# Patient Record
Sex: Male | Born: 1950 | Race: White | Hispanic: No | Marital: Single | Smoking: Former smoker
Health system: Southern US, Community
[De-identification: ages and names within clinical notes are randomized; demographics above are authoritative.]

## PROBLEM LIST (undated history)

## (undated) DIAGNOSIS — G5601 Carpal tunnel syndrome, right upper limb: Secondary | ICD-10-CM

## (undated) DIAGNOSIS — M1990 Unspecified osteoarthritis, unspecified site: Secondary | ICD-10-CM

## (undated) DIAGNOSIS — I77811 Abdominal aortic ectasia: Secondary | ICD-10-CM

## (undated) DIAGNOSIS — R351 Nocturia: Secondary | ICD-10-CM

## (undated) DIAGNOSIS — R7303 Prediabetes: Secondary | ICD-10-CM

## (undated) DIAGNOSIS — K219 Gastro-esophageal reflux disease without esophagitis: Secondary | ICD-10-CM

## (undated) DIAGNOSIS — E781 Pure hyperglyceridemia: Secondary | ICD-10-CM

## (undated) DIAGNOSIS — Z973 Presence of spectacles and contact lenses: Secondary | ICD-10-CM

## (undated) HISTORY — DX: Pure hyperglyceridemia: E78.1

---

## 1990-07-23 HISTORY — PX: NASAL SEPTUM SURGERY: SHX37

## 2012-07-14 ENCOUNTER — Ambulatory Visit: Payer: Self-pay | Admitting: Emergency Medicine

## 2012-07-14 VITALS — HR 77 | Temp 98.7°F | Resp 18 | Ht 73.5 in | Wt 240.0 lb

## 2012-07-14 DIAGNOSIS — Z0000 Encounter for general adult medical examination without abnormal findings: Secondary | ICD-10-CM

## 2012-07-14 DIAGNOSIS — Z0289 Encounter for other administrative examinations: Secondary | ICD-10-CM

## 2012-07-14 NOTE — Progress Notes (Signed)
  Subjective:    Patient ID: Kevin Carter, male    DOB: [birth year], 61 y.o.   MRN: [id number]  HPI patient is here for a self-pay DOT. He has no ongoing medical problems. He is on no medications. He has regular checkups by his regular physician.    Review of Systems     Objective:   Physical Exam HEENT exam is unremarkable. His neck is supple. Chest clear to auscultation and percussion. Heart regular rate without murmurs. Abdomen is soft nontender. Genital exam reveals no hernias. Musculoskeletal exam is normal. Repeat blood pressure done was 120/86       Assessment & Plan:  Patient needs to enters for her 2 year DOT card

## 2014-06-30 ENCOUNTER — Encounter: Payer: Self-pay | Admitting: Family Medicine

## 2014-06-30 ENCOUNTER — Ambulatory Visit (INDEPENDENT_AMBULATORY_CARE_PROVIDER_SITE_OTHER): Payer: Self-pay | Admitting: Family Medicine

## 2014-06-30 VITALS — BP 128/82 | HR 72 | Temp 98.0°F | Resp 17 | Ht 74.0 in | Wt 245.0 lb

## 2014-06-30 DIAGNOSIS — Z024 Encounter for examination for driving license: Secondary | ICD-10-CM

## 2014-06-30 DIAGNOSIS — Z029 Encounter for administrative examinations, unspecified: Secondary | ICD-10-CM

## 2014-06-30 NOTE — Progress Notes (Signed)
DOT physical exam  History: 63 year old man who is here for his DOT physical exam. He is not actually using it currently, but continues to maintain it as a backup plan in life  Past medical history: Operations: None Major illnesses: None Regular medications: None Drug allergies: None   Family history: No major problem prominent familial diseases   Social history: Single, stays with his elderly mother. 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 He did have a normal physical examination by his primary care physician not long ago,he needs to lose weight. TMs normal. Eyes PERRLA. Fundi benign. Throat clear. Neck supple without nodes thyromegaly. No carotid bruits. Chest is clear to auscultation. Heart regular without murmurs. Abdomen soft without mass or tenderness. Normal male external genitalia with testes descended. No hernias. Extremities unremarkable. Deep tendon reflexes trace, symmetrical. Straight leg raising test normal. Spine has good range of motion. Extremities unremarkable.  Assessment: Normal physical examination  Plan: 2 year card. Does wear correction lenses.

## 2014-06-30 NOTE — Patient Instructions (Signed)
Come back as needed

## 2015-03-21 ENCOUNTER — Encounter: Payer: Self-pay | Admitting: Cardiovascular Disease

## 2015-07-24 HISTORY — PX: COLONOSCOPY: SHX5424

## 2016-06-20 ENCOUNTER — Ambulatory Visit (INDEPENDENT_AMBULATORY_CARE_PROVIDER_SITE_OTHER): Payer: Self-pay | Admitting: Physician Assistant

## 2016-06-20 ENCOUNTER — Ambulatory Visit: Payer: Self-pay | Admitting: Family Medicine

## 2016-06-20 VITALS — BP 136/84 | HR 79 | Temp 98.7°F | Resp 17 | Ht 71.5 in | Wt 240.0 lb

## 2016-06-20 DIAGNOSIS — Z0289 Encounter for other administrative examinations: Secondary | ICD-10-CM

## 2016-06-20 NOTE — Patient Instructions (Signed)
     IF you received an x-ray today, you will receive an invoice from [facility]. Please contact [facility] at [phone] with questions or concerns regarding your invoice.   IF you received labwork today, you will receive an invoice from [facility]. Please contact [facility] at [phone] with questions or concerns regarding your invoice.   Our billing staff will not be able to assist you with questions regarding bills from these companies.  You will be contacted with the lab results as soon as they are available. The fastest way to get your results is to activate your My Chart account. Instructions are located on the last page of this paperwork. If you have not heard from us regarding the results in 2 weeks, please contact this office.      

## 2016-06-26 NOTE — Progress Notes (Signed)
[facility] [address], [place] [address] [phone]  Date:  06/20/2016   Name:  Kevin Carter   DOB:  05/24/1951   MRN:  [id number]  PCP:  No PCP Per Patient    History of Present Illness:  Kevin Carter is a 65 y.o. male patient who [facility] dot No concerns at this time.   There are no active problems to display for this patient.   No past medical history on file.  No past surgical history on file.  Social History  Substance Use Topics  . Smoking status: Never Smoker  . Smokeless tobacco: Never Used  . Alcohol use No    No family history on file.  No Known Allergies  Medication list has been reviewed and updated.  No current outpatient prescriptions on file prior to visit.   No current facility-administered medications on file prior to visit.     Review of Systems  Constitutional: Negative for chills and fever.  HENT: Negative for ear discharge, ear pain and sore throat.   Eyes: Negative for blurred vision and double vision.  Respiratory: Negative for cough, shortness of breath and wheezing.   Cardiovascular: Negative for chest pain, palpitations and leg swelling.  Gastrointestinal: Negative for diarrhea, nausea and vomiting.  Genitourinary: Negative for dysuria, frequency and hematuria.  Skin: Negative for itching and rash.  Neurological: Negative for dizziness and headaches.     Physical Examination: BP 136/84 (BP Location: Right Arm, Patient Position: Sitting, Cuff Size: Large)   Pulse 79   Temp 98.7 F (37.1 C) (Oral)   Resp 17   Ht 5' 11.5" (1.816 m)   Wt 240 lb (108.9 kg)   SpO2 97%   BMI 33.01 kg/m  Ideal Body Weight: Weight in (lb) to have BMI = 25: 181.4  Physical Exam  Constitutional: He is oriented to person, place, and time. He appears well-developed and well-nourished. No distress.  HENT:  Head: Normocephalic and atraumatic.  Right Ear: Tympanic membrane, external ear and ear canal  normal.  Left Ear: Tympanic membrane, external ear and ear canal normal.  Eyes: Conjunctivae and EOM are normal. Pupils are equal, round, and reactive to light.  Cardiovascular: Normal rate and regular rhythm.  Exam reveals no friction rub.   No murmur heard. Pulmonary/Chest: Effort normal. No respiratory distress. He has no wheezes.  Abdominal: Soft. Bowel sounds are normal. He exhibits no distension and no mass. There is no tenderness.  Musculoskeletal: Normal range of motion. He exhibits no edema or tenderness.  Neurological: He is alert and oriented to person, place, and time. He displays normal reflexes.  Skin: Skin is warm and dry. He is not diaphoretic.  Psychiatric: He has a normal mood and affect. His behavior is normal.    Visual Acuity Screening   Right eye Left eye Both eyes  Without correction:     With correction: 20/20 20/20 20/20   Hearing Screening Comments: Peripheral Vision: Right eye 85 degrees. Left eye 85 degrees. The patient can distinguish the colors red, amber and green. The patient was able to hear a forced whisper from L=10 R=10  feet.  Assessment and Plan: Kevin Carter is a 65 y.o. male who is here today for dot. 2 yr given. No diagnosis found.  Trena PlattStephanie Cordaryl Decelles, PA-C [facility] 12/5/20178:48 AM  .se  There are no diagnoses linked to this encounter.  Trena PlattStephanie Terrie Haring, PA-C [facility] 06/26/2016 8:46 AM

## 2017-04-23 ENCOUNTER — Ambulatory Visit: Payer: Self-pay | Admitting: Orthopedic Surgery

## 2017-04-23 ENCOUNTER — Telehealth: Payer: Self-pay | Admitting: Cardiovascular Disease

## 2017-04-23 NOTE — Telephone Encounter (Signed)
Pt brought in surgical clearance for Right Total Knee Replacement for Dr. Allyson Sabal. Pt has not seen Dr. Allyson Sabal and will need new patient appointment for this. Message routed to scheduling to arrange this.

## 2017-04-23 NOTE — Progress Notes (Signed)
Please place orders in EPIC as patient is being scheduled for a pre-op appointment! Thank you! 

## 2017-04-24 ENCOUNTER — Ambulatory Visit: Payer: Self-pay | Admitting: Cardiovascular Disease

## 2017-05-02 NOTE — Patient Instructions (Addendum)
Sajid Ruppert  05/02/2017   Your procedure is scheduled on: 05-13-17  Report to [facility] Main  Entrance Follow Sign to 1st Floor Short Stay at 5:50 AM   Call this number if you have problems the morning of surgery [phone]   Remember: ONLY 1 PERSON MAY GO WITH YOU TO SHORT STAY TO GET  READY MORNING OF YOUR SURGERY.  Do not eat food or drink liquids :After Midnight.     Take these medicines the morning of surgery with A SIP OF WATER: None                                You may not have any metal on your body including hair pins and              piercings  Do not wear jewelry, lotions, powders, deodorant             Men may shave face and neck.   Do not bring valuables to the hospital. [facility] IS NOT             RESPONSIBLE   FOR VALUABLES.  Contacts, dentures or bridgework may not be worn into surgery.  Leave suitcase in the car. After surgery it may be brought to your room.                 Please read over the following fact sheets you were given: _____________________________________________________________________             [facility] - Preparing for Surgery Before surgery, you can play an important role.  Because skin is not sterile, your skin needs to be as free of germs as possible.  You can reduce the number of germs on your skin by washing with CHG (chlorahexidine gluconate) soap before surgery.  CHG is an antiseptic cleaner which kills germs and bonds with the skin to continue killing germs even after washing. Please DO NOT use if you have an allergy to CHG or antibacterial soaps.  If your skin becomes reddened/irritated stop using the CHG and inform your nurse when you arrive at Short Stay. Do not shave (including legs and underarms) for at least 48 hours prior to the first CHG shower.  You may shave your face/neck. Please follow these instructions carefully:  1.  Shower with CHG Soap the night before surgery and the  morning of  Surgery.  2.  If you choose to wash your hair, wash your hair first as usual with your  normal  shampoo.  3.  After you shampoo, rinse your hair and body thoroughly to remove the  shampoo.                           4.  Use CHG as you would any other liquid soap.  You can apply chg directly  to the skin and wash                       Gently with a scrungie or clean washcloth.  5.  Apply the CHG Soap to your body ONLY FROM THE NECK DOWN.   Do not use on face/ open  Wound or open sores. Avoid contact with eyes, ears mouth and genitals (private parts).                       Wash face,  Genitals (private parts) with your normal soap.             6.  Wash thoroughly, paying special attention to the area where your surgery  will be performed.  7.  Thoroughly rinse your body with warm water from the neck down.  8.  DO NOT shower/wash with your normal soap after using and rinsing off  the CHG Soap.                9.  Pat yourself dry with a clean towel.            10.  Wear clean pajamas.            11.  Place clean sheets on your bed the night of your first shower and do not  sleep with pets. Day of Surgery : Do not apply any lotions/deodorants the morning of surgery.  Please wear clean clothes to the hospital/surgery center.  FAILURE TO FOLLOW THESE INSTRUCTIONS MAY RESULT IN THE CANCELLATION OF YOUR SURGERY PATIENT SIGNATURE_________________________________  NURSE SIGNATURE__________________________________  ________________________________________________________________________   Adam Phenix  An incentive spirometer is a tool that can help keep your lungs clear and active. This tool measures how well you are filling your lungs with each breath. Taking long deep breaths may help reverse or decrease the chance of developing breathing (pulmonary) problems (especially infection) following:  A long period of time when you are unable to move or be active. BEFORE  THE PROCEDURE   If the spirometer includes an indicator to show your best effort, your nurse or respiratory therapist will set it to a desired goal.  If possible, sit up straight or lean slightly forward. Try not to slouch.  Hold the incentive spirometer in an upright position. INSTRUCTIONS FOR USE  1. Sit on the edge of your bed if possible, or sit up as far as you can in bed or on a chair. 2. Hold the incentive spirometer in an upright position. 3. Breathe out normally. 4. Place the mouthpiece in your mouth and seal your lips tightly around it. 5. Breathe in slowly and as deeply as possible, raising the piston or the ball toward the top of the column. 6. Hold your breath for 3-5 seconds or for as long as possible. Allow the piston or ball to fall to the bottom of the column. 7. Remove the mouthpiece from your mouth and breathe out normally. 8. Rest for a few seconds and repeat Steps 1 through 7 at least 10 times every 1-2 hours when you are awake. Take your time and take a few normal breaths between deep breaths. 9. The spirometer may include an indicator to show your best effort. Use the indicator as a goal to work toward during each repetition. 10. After each set of 10 deep breaths, practice coughing to be sure your lungs are clear. If you have an incision (the cut made at the time of surgery), support your incision when coughing by placing a pillow or rolled up towels firmly against it. Once you are able to get out of bed, walk around indoors and cough well. You may stop using the incentive spirometer when instructed by your caregiver.  RISKS AND COMPLICATIONS  Take your time so you do not get  dizzy or light-headed.  If you are in pain, you may need to take or ask for pain medication before doing incentive spirometry. It is harder to take a deep breath if you are having pain. AFTER USE  Rest and breathe slowly and easily.  It can be helpful to keep track of a log of your progress.  Your caregiver can provide you with a simple table to help with this. If you are using the spirometer at home, follow these instructions: [place] IF:   You are having difficultly using the spirometer.  You have trouble using the spirometer as often as instructed.  Your pain medication is not giving enough relief while using the spirometer.  You develop fever of 100.5 F (38.1 C) or higher. SEEK IMMEDIATE MEDICAL CARE IF:   You cough up bloody sputum that had not been present before.  You develop fever of 102 F (38.9 C) or greater.  You develop worsening pain at or near the incision site. MAKE SURE YOU:   Understand these instructions.  Will watch your condition.  Will get help right away if you are not doing well or get worse. Document Released: 11/19/2006 Document Revised: 10/01/2011 Document Reviewed: 01/20/2007 ExitCare Patient Information 2014 ExitCare, [place].   ________________________________________________________________________  WHAT IS A BLOOD TRANSFUSION? Blood Transfusion Information  A transfusion is the replacement of blood or some of its parts. Blood is made up of multiple cells which provide different functions.  Red blood cells carry oxygen and are used for blood loss replacement.  White blood cells fight against infection.  Platelets control bleeding.  Plasma helps clot blood.  Other blood products are available for specialized needs, such as hemophilia or other clotting disorders. BEFORE THE TRANSFUSION  Who gives blood for transfusions?   Healthy volunteers who are fully evaluated to make sure their blood is safe. This is blood bank blood. Transfusion therapy is the safest it has ever been in the practice of medicine. Before blood is taken from a donor, a complete history is taken to make sure that person has no history of diseases nor engages in risky social behavior (examples are intravenous drug use or sexual activity with multiple  partners). The donor's travel history is screened to minimize risk of transmitting infections, such as malaria. The donated blood is tested for signs of infectious diseases, such as HIV and hepatitis. The blood is then tested to be sure it is compatible with you in order to minimize the chance of a transfusion reaction. If you or a relative donates blood, this is often done in anticipation of surgery and is not appropriate for emergency situations. It takes many days to process the donated blood. RISKS AND COMPLICATIONS Although transfusion therapy is very safe and saves many lives, the main dangers of transfusion include:   Getting an infectious disease.  Developing a transfusion reaction. This is an allergic reaction to something in the blood you were given. Every precaution is taken to prevent this. The decision to have a blood transfusion has been considered carefully by your caregiver before blood is given. Blood is not given unless the benefits outweigh the risks. AFTER THE TRANSFUSION  Right after receiving a blood transfusion, you will usually feel much better and more energetic. This is especially true if your red blood cells have gotten low (anemic). The transfusion raises the level of the red blood cells which carry oxygen, and this usually causes an energy increase.  The nurse administering the transfusion will  monitor you carefully for complications. HOME CARE INSTRUCTIONS  No special instructions are needed after a transfusion. You may find your energy is better. Speak with your caregiver about any limitations on activity for underlying diseases you may have. SEEK MEDICAL CARE IF:   Your condition is not improving after your transfusion.  You develop redness or irritation at the intravenous (IV) site. SEEK IMMEDIATE MEDICAL CARE IF:  Any of the following symptoms occur over the next 12 hours:  Shaking chills.  You have a temperature by mouth above 102 F (38.9 C), not  controlled by medicine.  Chest, back, or muscle pain.  People around you feel you are not acting correctly or are confused.  Shortness of breath or difficulty breathing.  Dizziness and fainting.  You get a rash or develop hives.  You have a decrease in urine output.  Your urine turns a dark color or changes to pink, red, or brown. Any of the following symptoms occur over the next 10 days:  You have a temperature by mouth above 102 F (38.9 C), not controlled by medicine.  Shortness of breath.  Weakness after normal activity.  The white part of the eye turns yellow (jaundice).  You have a decrease in the amount of urine or are urinating less often.  Your urine turns a dark color or changes to pink, red, or brown. Document Released: 07/06/2000 Document Revised: 10/01/2011 Document Reviewed: 02/23/2008 ExitCare Patient Information 2014 ExitCare, [place].  _______________________________________________________________________ WHAT IS A BLOOD TRANSFUSION? Blood Transfusion Information  A transfusion is the replacement of blood or some of its parts. Blood is made up of multiple cells which provide different functions.  Red blood cells carry oxygen and are used for blood loss replacement.  White blood cells fight against infection.  Platelets control bleeding.  Plasma helps clot blood.  Other blood products are available for specialized needs, such as hemophilia or other clotting disorders. BEFORE THE TRANSFUSION  Who gives blood for transfusions?   Healthy volunteers who are fully evaluated to make sure their blood is safe. This is blood bank blood. Transfusion therapy is the safest it has ever been in the practice of medicine. Before blood is taken from a donor, a complete history is taken to make sure that person has no history of diseases nor engages in risky social behavior (examples are intravenous drug use or sexual activity with multiple partners). The donor's travel  history is screened to minimize risk of transmitting infections, such as malaria. The donated blood is tested for signs of infectious diseases, such as HIV and hepatitis. The blood is then tested to be sure it is compatible with you in order to minimize the chance of a transfusion reaction. If you or a relative donates blood, this is often done in anticipation of surgery and is not appropriate for emergency situations. It takes many days to process the donated blood. RISKS AND COMPLICATIONS Although transfusion therapy is very safe and saves many lives, the main dangers of transfusion include:   Getting an infectious disease.  Developing a transfusion reaction. This is an allergic reaction to something in the blood you were given. Every precaution is taken to prevent this. The decision to have a blood transfusion has been considered carefully by your caregiver before blood is given. Blood is not given unless the benefits outweigh the risks. AFTER THE TRANSFUSION  Right after receiving a blood transfusion, you will usually feel much better and more energetic. This is especially true if your  red blood cells have gotten low (anemic). The transfusion raises the level of the red blood cells which carry oxygen, and this usually causes an energy increase.  The nurse administering the transfusion will monitor you carefully for complications. HOME CARE INSTRUCTIONS  No special instructions are needed after a transfusion. You may find your energy is better. Speak with your caregiver about any limitations on activity for underlying diseases you may have. SEEK MEDICAL CARE IF:   Your condition is not improving after your transfusion.  You develop redness or irritation at the intravenous (IV) site. SEEK IMMEDIATE MEDICAL CARE IF:  Any of the following symptoms occur over the next 12 hours:  Shaking chills.  You have a temperature by mouth above 102 F (38.9 C), not controlled by medicine.  Chest,  back, or muscle pain.  People around you feel you are not acting correctly or are confused.  Shortness of breath or difficulty breathing.  Dizziness and fainting.  You get a rash or develop hives.  You have a decrease in urine output.  Your urine turns a dark color or changes to pink, red, or brown. Any of the following symptoms occur over the next 10 days:  You have a temperature by mouth above 102 F (38.9 C), not controlled by medicine.  Shortness of breath.  Weakness after normal activity.  The white part of the eye turns yellow (jaundice).  You have a decrease in the amount of urine or are urinating less often.  Your urine turns a dark color or changes to pink, red, or brown. Document Released: 07/06/2000 Document Revised: 10/01/2011 Document Reviewed: 02/23/2008 [facility] Patient Information 2014 [place], [place].  _______________________________________________________________________

## 2017-05-03 ENCOUNTER — Encounter: Payer: Self-pay | Admitting: Physician Assistant

## 2017-05-03 ENCOUNTER — Ambulatory Visit (INDEPENDENT_AMBULATORY_CARE_PROVIDER_SITE_OTHER): Payer: Managed Care, Other (non HMO) | Admitting: Physician Assistant

## 2017-05-03 VITALS — BP 124/68 | HR 64 | Ht 71.5 in | Wt 213.0 lb

## 2017-05-03 DIAGNOSIS — Z01810 Encounter for preprocedural cardiovascular examination: Secondary | ICD-10-CM | POA: Diagnosis not present

## 2017-05-03 DIAGNOSIS — E781 Pure hyperglyceridemia: Secondary | ICD-10-CM | POA: Diagnosis not present

## 2017-05-03 DIAGNOSIS — R7303 Prediabetes: Secondary | ICD-10-CM | POA: Diagnosis not present

## 2017-05-03 DIAGNOSIS — F1011 Alcohol abuse, in remission: Secondary | ICD-10-CM | POA: Diagnosis not present

## 2017-05-03 NOTE — Patient Instructions (Signed)
Medication Instructions:   No changes  Labwork:   none  Testing/Procedures:  none  Follow-Up:  On as needed basis with Dr. San Morelle are cleared from a cardiac standpoint for your upcoming surgery by Dr. Despina Hick. We will send documentation to his office.   If you need a refill on your cardiac medications before your next appointment, please call your pharmacy.

## 2017-05-03 NOTE — Progress Notes (Signed)
Cardiology Office Note    Date:  05/03/2017   ID:  Kevin Carter, DOB 1951/03/13, MRN [id number]  PCP:  [facility]  Cardiologist:  Dr. Allyson Sabal remotely   Chief Complaint  Patient presents with  . New Patient (Initial Visit)    remotely seen by Dr. Allyson Sabal per patient report, case discussed with DOD Dr. Swaziland   Preoperative clearance requested by Dr. Despina Hick for right total knee arthroplasty  History of Present Illness:  Kevin Carter is a 66 y.o. male with PMH of prediabetes (hgb A1C 5.8 on 06/01/2016 at [facility]) and hypertriglyceridemia (triglyceride 232 on 06/01/2016). He was reportedly evaluated by Dr. Allyson Sabal in 2013 for atypical chest pain. He had a stress test on 04/01/2012 that showed EF 57%, exercise ability 9 METS, moderate perfusion defect due to infarct/scar with mild peri-infarct ischemia in the basal inferoseptal, basal inferior, and mid inferior region. Analysis of post exercise quantitative gated SPECT images showed a mild hypokinesis in the basal inferior, basal inferoseptal and mid inferior region. According to the patient, no further workup was done due to the atypical nature of the symptom. I am unable to locate any previous office note. Since then, he continued to work in the [employer]. In the past 6 months, he was able to lose 32 pounds. He used to drink about 6 beers per night, since July 4, he has stopped drinking as well. He is able to walk 4 blocks away from his home and back without any exertional chest discomfort or shortness breath. He is also able to climb up at least 2 flights of stairs without any issue. 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   I have reviewed his previous stress test image with Dr. Royann Shivers who made the original report in 2013, it was felt this is likely  a low risk study. I have also discussed the case with Dr. Swaziland DOD, given lack of chest discomfort and his exercise ability. No further workup is needed prior to right knee surgery by Dr. Merlyn Albert. He can follow-up with cardiology on an as-needed basis. Otherwise he has no lower extremity edema, orthopnea or PND.    Past Medical History:  Diagnosis Date  . Hypertriglyceridemia   . Prediabetes     Past Surgical History:  Procedure Laterality Date  . NASAL SEPTUM SURGERY      Current Medications: Outpatient Medications Prior to Visit  Medication Sig Dispense Refill  . aspirin EC 81 MG tablet Take 81 mg by mouth at bedtime.    . Multiple Vitamins-Minerals (MULTIVITAMIN PO) Take 1 tablet by mouth daily.    . Tetrahydrozoline HCl (VISINE OP) Place 2 drops into both eyes as needed (for dry eyes).     No facility-administered medications prior to visit.      Allergies:   Patient has no known allergies.   Social History   Social History  . Marital status: Single    Spouse name: N/A  . Number of children: N/A  . Years of education: N/A   Social History Main Topics  . Smoking status: Former Games developer  . Smokeless tobacco: Never Used     Comment: smoked for a few years when he was younger, no smoking since age 40  . Alcohol use No     Comment: last drank 01/23/2017, used to drink 6  beers a night  . Drug use: No  . Sexual activity: No   Other Topics Concern  . None   Social History Narrative  . None     Family History:  The patient's family history includes Diabetes in his father; Hypertension in his mother; Stomach cancer in his father.   ROS:   Please see the history of present illness.    ROS All other systems reviewed and are negative.   PHYSICAL EXAM:   VS:  BP 124/68   Pulse 64   Ht 5' 11.5" (1.816 m)   Wt 213 lb (96.6 kg)   BMI 29.29 kg/m    GEN: Well nourished, well developed, in no acute distress  HEENT: normal  Neck: no JVD, carotid bruits, or  masses Cardiac: RRR; no murmurs, rubs, or gallops,no edema  Respiratory:  clear to auscultation bilaterally, normal work of breathing GI: soft, nontender, nondistended, + BS MS: no deformity or atrophy  Skin: warm and dry, no rash Neuro:  Alert and Oriented x 3, Strength and sensation are intact Psych: euthymic mood, full affect  Wt Readings from Last 3 Encounters:  05/03/17 213 lb (96.6 kg)  06/20/16 240 lb (108.9 kg)  06/30/14 245 lb (111.1 kg)      Studies/Labs Reviewed:   EKG:  EKG is ordered today.  The ekg ordered today demonstrates Normal sinus rhythm without significant ST-T wave changes. No ischemia.  Recent Labs: No results found for requested labs within last 8760 hours.   Lipid Panel No results found for: CHOL, TRIG, HDL, CHOLHDL, VLDL, LDLCALC, LDLDIRECT  Additional studies/ records that were reviewed today include:   Myoview 04/01/2012    ASSESSMENT:    1. Preprocedural cardiovascular examination   2. Prediabetes   3. Hypertriglyceridemia without hypercholesterolemia   4. Alcohol abuse, in remission      PLAN:  In order of problems listed above:  1. Preoperative clearance requested by Dr. Despina Hick prior to right total knee arthroplasty: Although there was a low risk but somewhat abnormal Myoview in 2013, I have reviewed the image with Dr. Royann Shivers, cannot completely rule out diaphragmatic attenuation. No further workup was done in 2013 per patient. Since then, he has not had any exertional chest discomfort or shortness of breath. He carries heavy object at work without any issue. Given lack of EKG changes, no further workup is needed prior to his discharge weight. I have discussed the case with DOD Dr. Swaziland who also agrees.  2. Prediabetes: Last hemoglobin A1c 5.8 at [facility] in November 2017. Will defer to primary care provider for annual lab work. Since last year, he has intentionally lost more than 30 pounds, his hemoglobin A1C may have  improved.  3. Hypertriglyceridemia: Total cholesterol normal, triglyceride 232 in 2017, since he lost 30 pounds in the past year, we'll defer to primary care provider for annual lab work  4. History of alcohol abuse: Has not drank alcohol since fourth of July. Prior to that, he used to drink 6 beers per night. I strongly advised him not to resume.    Medication Adjustments/Labs and Tests Ordered: Current medicines are reviewed at length with the patient today.  Concerns regarding medicines are outlined above.  Medication changes, Labs and Tests ordered today are listed in the Patient Instructions below. Patient Instructions  Medication Instructions:   No changes  Labwork:   none  Testing/Procedures:  none  Follow-Up:  On as needed basis with Dr. San Morelle are cleared from  a cardiac standpoint for your upcoming surgery by Dr. Despina Hick. We will send documentation to his office.   If you need a refill on your cardiac medications before your next appointment, please call your pharmacy.      Ramond Dial, [place]  05/03/2017 12:09 PM    [facility] [address] [place], [place], [place]  [address] Phone: [phone]; Fax: [phone]

## 2017-05-06 ENCOUNTER — Encounter (HOSPITAL_COMMUNITY): Payer: Self-pay

## 2017-05-06 ENCOUNTER — Encounter (HOSPITAL_COMMUNITY)
Admission: RE | Admit: 2017-05-06 | Discharge: 2017-05-06 | Disposition: A | Discharge status: Home / Self Care | Payer: Managed Care, Other (non HMO) | Source: Ambulatory Visit | Attending: Orthopedic Surgery | Admitting: Orthopedic Surgery

## 2017-05-06 DIAGNOSIS — Z01812 Encounter for preprocedural laboratory examination: Secondary | ICD-10-CM | POA: Diagnosis not present

## 2017-05-06 DIAGNOSIS — M1711 Unilateral primary osteoarthritis, right knee: Secondary | ICD-10-CM | POA: Insufficient documentation

## 2017-05-06 DIAGNOSIS — Z0183 Encounter for blood typing: Secondary | ICD-10-CM | POA: Diagnosis not present

## 2017-05-06 HISTORY — DX: Prediabetes: R73.03

## 2017-05-06 LAB — SURGICAL PCR SCREEN
MRSA, PCR: NEGATIVE
STAPHYLOCOCCUS AUREUS: NEGATIVE

## 2017-05-06 LAB — COMPREHENSIVE METABOLIC PANEL
ALBUMIN: 4.2 g/dL (ref 3.5–5.0)
ALK PHOS: 58 U/L (ref 38–126)
ALT: 23 U/L (ref 17–63)
ANION GAP: 9 (ref 5–15)
AST: 21 U/L (ref 15–41)
BUN: 19 mg/dL (ref 6–20)
CALCIUM: 9.2 mg/dL (ref 8.9–10.3)
CO2: 25 mmol/L (ref 22–32)
Chloride: 101 mmol/L (ref 101–111)
Creatinine, Ser: 0.7 mg/dL (ref 0.61–1.24)
GFR calc non Af Amer: >60 mL/min (ref >60)
GLUCOSE: 119 mg/dL — AB (ref 65–99)
POTASSIUM: 4.2 mmol/L (ref 3.5–5.1)
SODIUM: 135 mmol/L (ref 135–145)
TOTAL PROTEIN: 7.4 g/dL (ref 6.5–8.1)
Total Bilirubin: 0.6 mg/dL (ref 0.3–1.2)

## 2017-05-06 LAB — CBC
HCT: 41.8 % (ref 39.0–52.0)
HEMOGLOBIN: 14.4 g/dL (ref 13.0–17.0)
MCH: 30.4 pg (ref 26.0–34.0)
MCHC: 34.4 g/dL (ref 30.0–36.0)
MCV: 88.4 fL (ref 78.0–100.0)
PLATELETS: 225 10*3/uL (ref 150–400)
RBC: 4.73 MIL/uL (ref 4.22–5.81)
RDW: 12.9 % (ref 11.5–15.5)
WBC: 6 10*3/uL (ref 4.0–10.5)

## 2017-05-06 LAB — PROTIME-INR
INR: 0.98
Prothrombin Time: 12.9 seconds (ref 11.4–15.2)

## 2017-05-06 LAB — APTT: APTT: 31 s (ref 24–36)

## 2017-05-06 LAB — HEMOGLOBIN A1C
Hgb A1c MFr Bld: 5.5 % (ref 4.8–5.6)
Mean Plasma Glucose: 111.15 mg/dL

## 2017-05-06 LAB — ABO/RH: ABO/RH(D): B NEG

## 2017-05-06 NOTE — Progress Notes (Signed)
05-03-17 (EPIC) Cardiac clearance from Azalee Course, [place] on chart

## 2017-05-12 ENCOUNTER — Ambulatory Visit: Payer: Self-pay | Admitting: Orthopedic Surgery

## 2017-05-12 NOTE — H&P (Signed)
Kevin Carter DOB: 11/02/1950 Single / Language: English / Race: White Male Date of Admission:  05/13/2017 CC:  Right knee pain History of Present Illness  The patient is a 66 year old male who comes in  for a preoperative History and Physical. The patient is scheduled for a right total knee arthroplasty to be performed by Dr. Frank V. Aluisio, MD at [facility] on 05-13-2017. The patient is a 65 year old male who presented for follow up of their knee. The patient is being followed for their right knee pain and osteoarthritis. They are now month(s) out from cortisone injection. Symptoms reported include: pain (medial side), pain at night, aching, grinding, giving way and pain with weightbearing. The patient feels that they are doing poorly and report their pain level to be moderate. The following medication has been used for pain control: none. The patient has not gotten any relief of their symptoms with Cortisone injections. The shots did not help much. Unfortunately his right knee pain is gone progressively worse over time. The injections are not working at all. He is at a stage now with the knee pain is essentially taking over his life. He is interested in proceeding with the knee replacement surgery. They have been treated conservatively in the past for the above stated problem and despite conservative measures, they continue to have progressive pain and severe functional limitations and dysfunction. They have failed non-operative management including home exercise, medications, and injections. It is felt that they would benefit from undergoing total joint replacement. Risks and benefits of the procedure have been discussed with the patient and they elect to proceed with surgery. There are no active contraindications to surgery such as ongoing infection or rapidly progressive neurological disease.  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)  Gastroesophageal Reflux Disease  Measles  Mumps  Hypertriglyceridemia   Allergies  No Known Drug Allergies  Family History  Cancer  father Diabetes Mellitus  father Hypertension  mother  Social History  Alcohol use  current drinker; drinks beer; 8-14 per week Children  0 Current work status  working full time Drug/Alcohol Rehab (Currently)  no Drug/Alcohol Rehab (Previously)  no Exercise  Exercises daily; does running / walking Illicit drug use  no Living situation  live alone Marital status  single Number of flights of stairs before winded  4-5 Pain Contract  no Tobacco / smoke exposure  no Tobacco use  former smoker; smoke(d) less than 1/2 pack(s) per day Post-Surgical Plans  Home With Family.  Medication History  Aspirin (81MG Tablet, Oral) Active. Mucinex (Oral) Specific strength unknown - Active. Multi Vitamin (Oral) Active.  Past Surgical History  Straighten Nasal Septum  Date: 1992. Colonoscopy  Date: 2016. 2005    Review of Systems General Present- Weight Loss. Not Present- Chills, Fatigue, Fever, Memory Loss, Night Sweats and Weight Gain. Skin Not Present- Eczema, Hives, Itching, Lesions and Rash. HEENT Not Present- Dentures, Double Vision, Headache, Hearing Loss, Tinnitus and Visual Loss. Respiratory Not Present- Allergies, Chronic Cough, Coughing up blood, Shortness of breath at rest and Shortness of breath with exertion. Cardiovascular Not Present- Chest Pain, Difficulty Breathing Lying Down, Murmur, Palpitations, Racing/skipping heartbeats and Swelling. Gastrointestinal Not Present- Abdominal Pain, Bloody Stool, Constipation, Diarrhea, Difficulty Swallowing, Heartburn, Jaundice, Loss of appetitie, Nausea and Vomiting. Male Genitourinary Present- Urinating at Night. Not Present- Blood in Urine, Discharge, Flank Pain, Incontinence, Painful Urination, Urgency,    Urinary frequency, Urinary Retention and Weak urinary stream. Musculoskeletal Present- Joint Pain and Leg Cramps. Not Present- Back Pain, Joint Swelling, Morning Stiffness, Muscle Pain, Muscle Weakness and Spasms. Neurological Present- Numbness (carpal tunnel). Not Present- Blackout spells, Difficulty with balance, Dizziness, Paralysis, Tremor and Weakness. Psychiatric Present- Change in Sleep Pattern. Not Present- Insomnia.  Vitals  Weight: 215 lb Height: 74in Weight was reported by patient. Height was reported by patient. Body Surface Area: 2.24 m Body Mass Index: 27.6 kg/m  Pulse: 60 (Regular)  BP: 130/64 (Sitting, Right Arm, Standard)     Physical Exa General Mental Status -Alert, cooperative and good historian. General Appearance-pleasant, Not in acute distress. Orientation-Oriented X3. Build & Nutrition-Well nourished and Well developed.  Head and Neck Head-normocephalic, atraumatic . Neck Global Assessment - supple, no bruit auscultated on the right, no bruit auscultated on the left.  Eye Pupil - Bilateral-Regular and Round. Motion - Bilateral-EOMI.  Chest and Lung Exam Auscultation Breath sounds - clear at anterior chest wall and clear at posterior chest wall. Adventitious sounds - No Adventitious sounds.  Cardiovascular Auscultation Rhythm - Regular rate and rhythm. Heart Sounds - S1 WNL and S2 WNL. Murmurs & Other Heart Sounds - Auscultation of the heart reveals - No Murmurs.  Abdomen Palpation/Percussion Tenderness - Abdomen is non-tender to palpation. Rigidity (guarding) - Abdomen is soft. Auscultation Auscultation of the abdomen reveals - Bowel sounds normal.  Male Genitourinary Note: Not done, not pertinent to present illness   Musculoskeletal Note: Evaluation of both hips shows normal range of motion. Left knee shows no effusion. Range of motion is 0-125. There is no tenderness or instability of the left knee. Right knee  shows varus deformity. Range 5to 120. He has tenderness along the medial aspect of the right knee. There is no lateral tenderness. There is no instability noted. Pulses sensation and motor are intact distally. Has an antalgic gait pattern on the right. Radiographs are reviewed AP and lateral of the knee has bone-on-bone medial and patellofemoral compartments.   Assessment & Plan  Primary osteoarthritis of right knee (M17.11)  Note:Surgical Plans: Right Total Knee Replacement  Disposition: Home with family  PCP: Dr. Fred Wilson Cards: Dr. Berry GI: Dr. Hung  IV TXA  Anesthesia Issues: None  Patient was instructed on what medications to stop prior to surgery.  Signed electronically by Alexzandrew L Perkins, III PA-C 

## 2017-05-13 ENCOUNTER — Inpatient Hospital Stay (HOSPITAL_COMMUNITY): Payer: Managed Care, Other (non HMO) | Admitting: Anesthesiology

## 2017-05-13 ENCOUNTER — Encounter (HOSPITAL_COMMUNITY)
Admission: RE | Disposition: A | Discharge status: Home / Self Care | Payer: Self-pay | Source: Ambulatory Visit | Attending: Orthopedic Surgery

## 2017-05-13 ENCOUNTER — Inpatient Hospital Stay (HOSPITAL_COMMUNITY)
Admission: RE | Admit: 2017-05-13 | Discharge: 2017-05-15 | DRG: 470 | Disposition: A | Discharge status: Home / Self Care | Payer: Managed Care, Other (non HMO) | Source: Ambulatory Visit | Attending: Orthopedic Surgery | Admitting: Orthopedic Surgery

## 2017-05-13 ENCOUNTER — Encounter (HOSPITAL_COMMUNITY): Payer: Self-pay | Admitting: *Deleted

## 2017-05-13 DIAGNOSIS — M25561 Pain in right knee: Secondary | ICD-10-CM | POA: Diagnosis present

## 2017-05-13 DIAGNOSIS — Z87891 Personal history of nicotine dependence: Secondary | ICD-10-CM

## 2017-05-13 DIAGNOSIS — E781 Pure hyperglyceridemia: Secondary | ICD-10-CM | POA: Diagnosis present

## 2017-05-13 DIAGNOSIS — M1711 Unilateral primary osteoarthritis, right knee: Principal | ICD-10-CM | POA: Diagnosis present

## 2017-05-13 DIAGNOSIS — R7303 Prediabetes: Secondary | ICD-10-CM | POA: Diagnosis present

## 2017-05-13 DIAGNOSIS — M179 Osteoarthritis of knee, unspecified: Secondary | ICD-10-CM

## 2017-05-13 DIAGNOSIS — Z79899 Other long term (current) drug therapy: Secondary | ICD-10-CM

## 2017-05-13 DIAGNOSIS — Z7982 Long term (current) use of aspirin: Secondary | ICD-10-CM | POA: Diagnosis not present

## 2017-05-13 DIAGNOSIS — K219 Gastro-esophageal reflux disease without esophagitis: Secondary | ICD-10-CM | POA: Diagnosis present

## 2017-05-13 DIAGNOSIS — M1710 Unilateral primary osteoarthritis, unspecified knee: Secondary | ICD-10-CM | POA: Diagnosis present

## 2017-05-13 DIAGNOSIS — M25761 Osteophyte, right knee: Secondary | ICD-10-CM | POA: Diagnosis present

## 2017-05-13 HISTORY — PX: TOTAL KNEE ARTHROPLASTY: SHX125

## 2017-05-13 SURGERY — ARTHROPLASTY, KNEE, TOTAL
Anesthesia: Regional | Site: Knee | Laterality: Right

## 2017-05-13 MED ORDER — ACETAMINOPHEN 500 MG PO TABS
1000.0000 mg | ORAL_TABLET | Freq: Four times a day (QID) | ORAL | Status: AC
  Administered 2017-05-13 – 2017-05-14 (×4): 1000 mg via ORAL
  Filled 2017-05-13 (×4): qty 2

## 2017-05-13 MED ORDER — PHENOL 1.4 % MT LIQD: 1.0000 | OROMUCOSAL | Status: DC | PRN

## 2017-05-13 MED ORDER — OXYCODONE HCL 5 MG PO TABS
10.0000 mg | ORAL_TABLET | ORAL | Status: DC | PRN
  Administered 2017-05-13 – 2017-05-15 (×9): 10 mg via ORAL
  Filled 2017-05-13 (×9): qty 2

## 2017-05-13 MED ORDER — FENTANYL CITRATE (PF) 100 MCG/2ML IJ SOLN
INTRAMUSCULAR | Status: AC
  Administered 2017-05-13: 100 ug via INTRAVENOUS
  Filled 2017-05-13: qty 2

## 2017-05-13 MED ORDER — LIDOCAINE 2% (20 MG/ML) 5 ML SYRINGE
INTRAMUSCULAR | Status: DC | PRN
  Administered 2017-05-13: 60 mg via INTRAVENOUS

## 2017-05-13 MED ORDER — OXYCODONE HCL 5 MG PO TABS
5.0000 mg | ORAL_TABLET | ORAL | Status: DC | PRN
  Administered 2017-05-13: 12:00:00 5 mg via ORAL
  Filled 2017-05-13 (×2): qty 1

## 2017-05-13 MED ORDER — FENTANYL CITRATE (PF) 100 MCG/2ML IJ SOLN
INTRAMUSCULAR | Status: AC
Start: 2017-05-13 — End: 2017-05-13
  Filled 2017-05-13: qty 2

## 2017-05-13 MED ORDER — BUPIVACAINE HCL (PF) 0.75 % IJ SOLN
INTRAMUSCULAR | Status: DC | PRN
  Administered 2017-05-13: 2 mL via INTRATHECAL

## 2017-05-13 MED ORDER — MIDAZOLAM HCL 2 MG/2ML IJ SOLN
INTRAMUSCULAR | Status: AC
  Administered 2017-05-13: 1 mg via INTRAVENOUS
  Filled 2017-05-13: qty 2

## 2017-05-13 MED ORDER — BUPIVACAINE LIPOSOME 1.3 % IJ SUSP
20.0000 mL | Freq: Once | INTRAMUSCULAR | Status: DC
  Filled 2017-05-13: qty 20

## 2017-05-13 MED ORDER — PROPOFOL 500 MG/50ML IV EMUL
INTRAVENOUS | Status: DC | PRN
  Administered 2017-05-13: 200 mg via INTRAVENOUS

## 2017-05-13 MED ORDER — MORPHINE SULFATE (PF) 4 MG/ML IV SOLN
1.0000 mg | INTRAVENOUS | Status: DC | PRN
  Administered 2017-05-13: 12:00:00 1 mg via INTRAVENOUS
  Filled 2017-05-13: qty 1

## 2017-05-13 MED ORDER — HYDROMORPHONE HCL 1 MG/ML IJ SOLN: 0.2500 mg | INTRAMUSCULAR | Status: DC | PRN

## 2017-05-13 MED ORDER — MIDAZOLAM HCL 2 MG/2ML IJ SOLN
1.0000 mg | INTRAMUSCULAR | Status: DC
  Administered 2017-05-13: 1 mg via INTRAVENOUS

## 2017-05-13 MED ORDER — OXYCODONE HCL 5 MG/5ML PO SOLN: 5.0000 mg | Freq: Once | ORAL | Status: DC | PRN

## 2017-05-13 MED ORDER — CEFAZOLIN SODIUM-DEXTROSE 2-4 GM/100ML-% IV SOLN
INTRAVENOUS | Status: AC
  Filled 2017-05-13: qty 100

## 2017-05-13 MED ORDER — SODIUM CHLORIDE 0.9 % IJ SOLN
INTRAMUSCULAR | Status: DC | PRN
  Administered 2017-05-13: 60 mL

## 2017-05-13 MED ORDER — ACETAMINOPHEN 325 MG PO TABS: 650.0000 mg | ORAL_TABLET | ORAL | Status: DC | PRN

## 2017-05-13 MED ORDER — DOCUSATE SODIUM 100 MG PO CAPS
100.0000 mg | ORAL_CAPSULE | Freq: Two times a day (BID) | ORAL | Status: DC
  Administered 2017-05-13 – 2017-05-15 (×4): 100 mg via ORAL
  Filled 2017-05-13 (×4): qty 1

## 2017-05-13 MED ORDER — GABAPENTIN 300 MG PO CAPS
300.0000 mg | ORAL_CAPSULE | Freq: Once | ORAL | Status: AC
  Administered 2017-05-13: 300 mg via ORAL

## 2017-05-13 MED ORDER — OXYCODONE HCL 5 MG PO TABS: 5.0000 mg | ORAL_TABLET | Freq: Once | ORAL | Status: DC | PRN

## 2017-05-13 MED ORDER — CHLORHEXIDINE GLUCONATE 4 % EX LIQD: 60.0000 mL | Freq: Once | CUTANEOUS | Status: DC

## 2017-05-13 MED ORDER — CEFAZOLIN SODIUM-DEXTROSE 2-4 GM/100ML-% IV SOLN
2.0000 g | INTRAVENOUS | Status: AC
  Administered 2017-05-13: 2 g via INTRAVENOUS

## 2017-05-13 MED ORDER — STERILE WATER FOR IRRIGATION IR SOLN
Status: DC | PRN
  Administered 2017-05-13: 3000 mL

## 2017-05-13 MED ORDER — PHENYLEPHRINE HCL 10 MG/ML IJ SOLN
INTRAMUSCULAR | Status: DC | PRN
  Administered 2017-05-13: 100 ug via INTRAVENOUS

## 2017-05-13 MED ORDER — GABAPENTIN 300 MG PO CAPS
ORAL_CAPSULE | ORAL | Status: AC
  Administered 2017-05-13: 300 mg via ORAL
  Filled 2017-05-13: qty 1

## 2017-05-13 MED ORDER — METHOCARBAMOL 500 MG PO TABS
500.0000 mg | ORAL_TABLET | Freq: Four times a day (QID) | ORAL | Status: DC | PRN
  Administered 2017-05-14: 10:00:00 500 mg via ORAL
  Filled 2017-05-13: qty 1

## 2017-05-13 MED ORDER — POLYETHYLENE GLYCOL 3350 17 G PO PACK: 17.0000 g | PACK | Freq: Every day | ORAL | Status: DC | PRN

## 2017-05-13 MED ORDER — METOCLOPRAMIDE HCL 5 MG/ML IJ SOLN: 5.0000 mg | Freq: Three times a day (TID) | INTRAMUSCULAR | Status: DC | PRN

## 2017-05-13 MED ORDER — SODIUM CHLORIDE 0.9 % IJ SOLN
INTRAMUSCULAR | Status: AC
  Filled 2017-05-13: qty 50

## 2017-05-13 MED ORDER — ROPIVACAINE HCL 7.5 MG/ML IJ SOLN
INTRAMUSCULAR | Status: DC | PRN
  Administered 2017-05-13: 20 mL via PERINEURAL

## 2017-05-13 MED ORDER — TRANEXAMIC ACID 1000 MG/10ML IV SOLN
1000.0000 mg | INTRAVENOUS | Status: AC
  Administered 2017-05-13: 1000 mg via INTRAVENOUS
  Filled 2017-05-13: qty 1100

## 2017-05-13 MED ORDER — PROPOFOL 10 MG/ML IV BOLUS
INTRAVENOUS | Status: AC
  Filled 2017-05-13: qty 60

## 2017-05-13 MED ORDER — MIDAZOLAM HCL 5 MG/5ML IJ SOLN
INTRAMUSCULAR | Status: DC | PRN
Start: 2017-05-13 — End: 2017-05-13
  Administered 2017-05-13: 2 mg via INTRAVENOUS

## 2017-05-13 MED ORDER — SODIUM CHLORIDE 0.9 % IJ SOLN
INTRAMUSCULAR | Status: AC
  Filled 2017-05-13: qty 10

## 2017-05-13 MED ORDER — FLEET ENEMA 7-19 GM/118ML RE ENEM: 1.0000 | ENEMA | Freq: Once | RECTAL | Status: DC | PRN

## 2017-05-13 MED ORDER — SODIUM CHLORIDE 0.9 % IV SOLN
INTRAVENOUS | Status: DC
  Administered 2017-05-13: 12:00:00 via INTRAVENOUS

## 2017-05-13 MED ORDER — TRAMADOL HCL 50 MG PO TABS: 50.0000 mg | ORAL_TABLET | Freq: Four times a day (QID) | ORAL | Status: DC | PRN

## 2017-05-13 MED ORDER — FENTANYL CITRATE (PF) 100 MCG/2ML IJ SOLN
INTRAMUSCULAR | Status: DC | PRN
  Administered 2017-05-13: 100 ug via INTRAVENOUS

## 2017-05-13 MED ORDER — EPHEDRINE SULFATE 50 MG/ML IJ SOLN
INTRAMUSCULAR | Status: DC | PRN
  Administered 2017-05-13: 15 mg via INTRAVENOUS
  Administered 2017-05-13 (×3): 10 mg via INTRAVENOUS

## 2017-05-13 MED ORDER — MIDAZOLAM HCL 2 MG/2ML IJ SOLN
INTRAMUSCULAR | Status: AC
  Filled 2017-05-13: qty 2

## 2017-05-13 MED ORDER — ACETAMINOPHEN 650 MG RE SUPP: 650.0000 mg | RECTAL | Status: DC | PRN

## 2017-05-13 MED ORDER — BUPIVACAINE LIPOSOME 1.3 % IJ SUSP
INTRAMUSCULAR | Status: DC | PRN
  Administered 2017-05-13: 20 mL

## 2017-05-13 MED ORDER — DEXAMETHASONE SODIUM PHOSPHATE 10 MG/ML IJ SOLN
10.0000 mg | Freq: Once | INTRAMUSCULAR | Status: AC
  Administered 2017-05-14: 10:00:00 10 mg via INTRAVENOUS
  Filled 2017-05-13: qty 1

## 2017-05-13 MED ORDER — METOCLOPRAMIDE HCL 5 MG PO TABS: 5.0000 mg | ORAL_TABLET | Freq: Three times a day (TID) | ORAL | Status: DC | PRN

## 2017-05-13 MED ORDER — DIPHENHYDRAMINE HCL 12.5 MG/5ML PO ELIX: 12.5000 mg | ORAL_SOLUTION | ORAL | Status: DC | PRN

## 2017-05-13 MED ORDER — ONDANSETRON HCL 4 MG PO TABS: 4.0000 mg | ORAL_TABLET | Freq: Four times a day (QID) | ORAL | Status: DC | PRN

## 2017-05-13 MED ORDER — LACTATED RINGERS IV SOLN
INTRAVENOUS | Status: DC
  Administered 2017-05-13 (×2): via INTRAVENOUS

## 2017-05-13 MED ORDER — RIVAROXABAN 10 MG PO TABS
10.0000 mg | ORAL_TABLET | Freq: Every day | ORAL | Status: DC
  Administered 2017-05-14 – 2017-05-15 (×2): 10 mg via ORAL
  Filled 2017-05-13 (×2): qty 1

## 2017-05-13 MED ORDER — TRANEXAMIC ACID 1000 MG/10ML IV SOLN
1000.0000 mg | Freq: Once | INTRAVENOUS | Status: AC
  Administered 2017-05-13: 12:00:00 1000 mg via INTRAVENOUS
  Filled 2017-05-13: qty 1100

## 2017-05-13 MED ORDER — BISACODYL 10 MG RE SUPP: 10.0000 mg | Freq: Every day | RECTAL | Status: DC | PRN

## 2017-05-13 MED ORDER — SODIUM CHLORIDE 0.9 % IR SOLN
Status: DC | PRN
  Administered 2017-05-13: 1000 mL

## 2017-05-13 MED ORDER — METHOCARBAMOL 1000 MG/10ML IJ SOLN
500.0000 mg | Freq: Four times a day (QID) | INTRAVENOUS | Status: DC | PRN
  Administered 2017-05-13: 500 mg via INTRAVENOUS
  Filled 2017-05-13: qty 550

## 2017-05-13 MED ORDER — ACETAMINOPHEN 10 MG/ML IV SOLN
1000.0000 mg | Freq: Once | INTRAVENOUS | Status: AC
  Administered 2017-05-13: 1000 mg via INTRAVENOUS

## 2017-05-13 MED ORDER — FENTANYL CITRATE (PF) 100 MCG/2ML IJ SOLN
50.0000 ug | INTRAMUSCULAR | Status: DC | PRN
  Administered 2017-05-13: 100 ug via INTRAVENOUS

## 2017-05-13 MED ORDER — DEXAMETHASONE SODIUM PHOSPHATE 10 MG/ML IJ SOLN
10.0000 mg | Freq: Once | INTRAMUSCULAR | Status: AC
  Administered 2017-05-13: 10 mg via INTRAVENOUS

## 2017-05-13 MED ORDER — ONDANSETRON HCL 4 MG/2ML IJ SOLN: 4.0000 mg | Freq: Four times a day (QID) | INTRAMUSCULAR | Status: DC | PRN

## 2017-05-13 MED ORDER — FENTANYL CITRATE (PF) 100 MCG/2ML IJ SOLN: 50.0000 ug | INTRAMUSCULAR | Status: DC

## 2017-05-13 MED ORDER — PROPOFOL 10 MG/ML IV BOLUS
INTRAVENOUS | Status: AC
  Filled 2017-05-13: qty 20

## 2017-05-13 MED ORDER — ONDANSETRON HCL 4 MG/2ML IJ SOLN
INTRAMUSCULAR | Status: DC | PRN
  Administered 2017-05-13: 4 mg via INTRAVENOUS

## 2017-05-13 MED ORDER — MENTHOL 3 MG MT LOZG: 1.0000 | LOZENGE | OROMUCOSAL | Status: DC | PRN

## 2017-05-13 MED ORDER — ACETAMINOPHEN 10 MG/ML IV SOLN
INTRAVENOUS | Status: AC
  Filled 2017-05-13: qty 100

## 2017-05-13 MED ORDER — CEFAZOLIN SODIUM-DEXTROSE 2-4 GM/100ML-% IV SOLN
2.0000 g | Freq: Four times a day (QID) | INTRAVENOUS | Status: AC
  Administered 2017-05-13 (×2): 2 g via INTRAVENOUS
  Filled 2017-05-13 (×2): qty 100

## 2017-05-13 SURGICAL SUPPLY — 50 items
BAG DECANTER FOR FLEXI CONT (MISCELLANEOUS) ×3 IMPLANT
BAG ZIPLOCK 12X15 (MISCELLANEOUS) ×3 IMPLANT
BANDAGE ACE 6X5 VEL STRL LF (GAUZE/BANDAGES/DRESSINGS) ×3 IMPLANT
BLADE SAG 18X100X1.27 (BLADE) ×3 IMPLANT
BLADE SAW SGTL 11.0X1.19X90.0M (BLADE) ×3 IMPLANT
BOWL SMART MIX CTS (DISPOSABLE) ×3 IMPLANT
CAPT KNEE TOTAL 3 ATTUNE ×3 IMPLANT
CEMENT HV SMART SET (Cement) ×6 IMPLANT
CLOSURE WOUND 1/2 X4 (GAUZE/BANDAGES/DRESSINGS) ×1
COVER SURGICAL LIGHT HANDLE (MISCELLANEOUS) ×3 IMPLANT
CUFF TOURN SGL QUICK 34 (TOURNIQUET CUFF) ×2
CUFF TRNQT CYL 34X4X40X1 (TOURNIQUET CUFF) ×1 IMPLANT
DECANTER SPIKE VIAL GLASS SM (MISCELLANEOUS) ×3 IMPLANT
DRAPE U-SHAPE 47X51 STRL (DRAPES) ×3 IMPLANT
DRSG ADAPTIC 3X8 NADH LF (GAUZE/BANDAGES/DRESSINGS) ×3 IMPLANT
DRSG PAD ABDOMINAL 8X10 ST (GAUZE/BANDAGES/DRESSINGS) ×3 IMPLANT
DURAPREP 26ML APPLICATOR (WOUND CARE) ×3 IMPLANT
ELECT REM PT RETURN 15FT ADLT (MISCELLANEOUS) ×3 IMPLANT
EVACUATOR 1/8 PVC DRAIN (DRAIN) ×3 IMPLANT
GAUZE SPONGE 4X4 12PLY STRL (GAUZE/BANDAGES/DRESSINGS) ×3 IMPLANT
GLOVE BIO SURGEON STRL SZ7.5 (GLOVE) IMPLANT
GLOVE BIO SURGEON STRL SZ8 (GLOVE) ×3 IMPLANT
GLOVE BIOGEL PI IND STRL 6.5 (GLOVE) IMPLANT
GLOVE BIOGEL PI IND STRL 8 (GLOVE) ×1 IMPLANT
GLOVE BIOGEL PI INDICATOR 6.5 (GLOVE)
GLOVE BIOGEL PI INDICATOR 8 (GLOVE) ×2
GLOVE SURG SS PI 6.5 STRL IVOR (GLOVE) IMPLANT
GOWN STRL REUS W/TWL LRG LVL3 (GOWN DISPOSABLE) ×3 IMPLANT
GOWN STRL REUS W/TWL XL LVL3 (GOWN DISPOSABLE) IMPLANT
HANDPIECE INTERPULSE COAX TIP (DISPOSABLE) ×2
IMMOBILIZER KNEE 20 (SOFTGOODS) ×3
IMMOBILIZER KNEE 20 THIGH 36 (SOFTGOODS) ×1 IMPLANT
MANIFOLD NEPTUNE II (INSTRUMENTS) ×3 IMPLANT
NS IRRIG 1000ML POUR BTL (IV SOLUTION) ×3 IMPLANT
PACK TOTAL KNEE CUSTOM (KITS) ×3 IMPLANT
PAD ABD 8X10 STRL (GAUZE/BANDAGES/DRESSINGS) ×3 IMPLANT
PADDING CAST COTTON 6X4 STRL (CAST SUPPLIES) ×3 IMPLANT
POSITIONER SURGICAL ARM (MISCELLANEOUS) ×3 IMPLANT
SET HNDPC FAN SPRY TIP SCT (DISPOSABLE) ×1 IMPLANT
STRIP CLOSURE SKIN 1/2X4 (GAUZE/BANDAGES/DRESSINGS) ×2 IMPLANT
SUT MNCRL AB 4-0 PS2 18 (SUTURE) ×3 IMPLANT
SUT STRATAFIX 0 PDS 27 VIOLET (SUTURE) ×3
SUT VIC AB 2-0 CT1 27 (SUTURE) ×6
SUT VIC AB 2-0 CT1 TAPERPNT 27 (SUTURE) ×3 IMPLANT
SUTURE STRATFX 0 PDS 27 VIOLET (SUTURE) ×1 IMPLANT
SYR 30ML LL (SYRINGE) ×6 IMPLANT
TRAY FOLEY W/METER SILVER 16FR (SET/KITS/TRAYS/PACK) ×3 IMPLANT
WATER STERILE IRR 1000ML POUR (IV SOLUTION) ×6 IMPLANT
WRAP KNEE MAXI GEL POST OP (GAUZE/BANDAGES/DRESSINGS) ×3 IMPLANT
YANKAUER SUCT BULB TIP 10FT TU (MISCELLANEOUS) ×3 IMPLANT

## 2017-05-13 NOTE — Anesthesia Procedure Notes (Addendum)
Anesthesia Regional Block: Adductor canal block   Pre-Anesthetic Checklist: ,, timeout performed, Correct Patient, Correct Site, Correct Laterality, Correct Procedure, Correct Position, site marked, Risks and benefits discussed, Surgical consent,  Pre-op evaluation,  Post-op pain management  Laterality: Right  Prep: chloraprep       Needles:   Needle Type: Echogenic Stimulator Needle     Needle Length: 9cm  Needle Gauge: 21   Needle insertion depth: 5 cm   Additional Needles:   Procedures:,,,, ultrasound used (permanent image in chart),,,,  Narrative:  Start time: 05/13/2017 7:40 AM End time: 05/13/2017 7:56 AM Injection made incrementally with aspirations every 5 mL.  Performed by: Personally  Anesthesiologist: Konrad Hoak

## 2017-05-13 NOTE — Evaluation (Signed)
Physical Therapy Evaluation Patient Details Name: Kevin Carter MRN: [id number] DOB: 01-12-1951 Today's Date: 05/13/2017   History of Present Illness  s/p R TKA  Clinical Impression  Pt is s/p TKA resulting in the deficits listed below (see PT Problem List).  Pt will benefit from skilled PT to increase their independence and safety with mobility to allow discharge to the venue listed below.      Follow Up Recommendations Home health PT    Equipment Recommendations  Rolling walker with 5" wheels;3in1 (PT)    Recommendations for Other Services       Precautions / Restrictions Precautions Precautions: Fall;Knee Required Braces or Orthoses: Knee Immobilizer - Right Knee Immobilizer - Right: Discontinue once straight leg raise with < 10 degree lag Restrictions Weight Bearing Restrictions: No Other Position/Activity Restrictions: WBAT      Mobility  Bed Mobility Overal bed mobility: Needs Assistance Bed Mobility: Supine to Sit     Supine to sit: Supervision     General bed mobility comments: cues for technique and self assist  Transfers Overall transfer level: Needs assistance Equipment used: Rolling walker (2 wheeled) Transfers: Sit to/from Stand Sit to Stand: Min guard;Min assist         General transfer comment: cues for hand placement and RLE position  Ambulation/Gait Ambulation/Gait assistance: Min guard;Min assist Ambulation Distance (Feet): 45 Feet Assistive device: Rolling walker (2 wheeled) Gait Pattern/deviations: Step-to pattern;Antalgic     General Gait Details: cues for sequence  Stairs            Wheelchair Mobility    Modified Rankin (Stroke Patients Only)       Balance                                             Pertinent Vitals/Pain Pain Assessment: 0-10 Pain Score: 3  Pain Location: right knee Pain Descriptors / Indicators: Aching;Sore Pain Intervention(s): Limited activity within patient's  tolerance;Monitored during session    Home Living Family/patient expects to be discharged to:: Private residence Living Arrangements: Parent;Other (Comment)   Type of Home: House Home Access: Stairs to enter Entrance Stairs-Rails: Right Entrance Stairs-Number of Steps: 4 Home Layout: One level Home Equipment: None      Prior Function Level of Independence: Independent               Hand Dominance        Extremity/Trunk Assessment   Upper Extremity Assessment Upper Extremity Assessment: Defer to OT evaluation;Overall WFL for tasks assessed    Lower Extremity Assessment Lower Extremity Assessment: RLE deficits/detail RLE Deficits / Details: ankle WFL; knee extension adn hip flexion 2+/5, limited by post op pain and weakness       Communication      Cognition Arousal/Alertness: Awake/alert Behavior During Therapy: WFL for tasks assessed/performed Overall Cognitive Status: Within Functional Limits for tasks assessed                                        General Comments      Exercises Total Joint Exercises Ankle Circles/Pumps: AROM;Both;10 reps Quad Sets: Both;10 reps;AROM   Assessment/Plan    PT Assessment Patient needs continued PT services  PT Problem List Decreased strength;Decreased range of motion;Decreased mobility;Pain;Decreased knowledge of use of DME  PT Treatment Interventions Functional mobility training;Stair training;Gait training;DME instruction;Therapeutic activities;Therapeutic exercise;Patient/family education    PT Goals (Current goals can be found in the Care Plan section)  Acute Rehab PT Goals Patient Stated Goal: return to riding motorcycles PT Goal Formulation: With patient Time For Goal Achievement: 05/17/17 Potential to Achieve Goals: Good    Frequency 7X/week   Barriers to discharge        Co-evaluation               AM-PAC PT "6 Clicks" Daily Activity  Outcome Measure Difficulty turning  over in bed (including adjusting bedclothes, sheets and blankets)?: Unable Difficulty moving from lying on back to sitting on the side of the bed? : Unable Difficulty sitting down on and standing up from a chair with arms (e.g., wheelchair, bedside commode, etc,.)?: Unable Help needed moving to and from a bed to chair (including a wheelchair)?: A Little Help needed walking in hospital room?: A Little Help needed climbing 3-5 steps with a railing? : A Little 6 Click Score: 12    End of Session Equipment Utilized During Treatment: Gait belt;Right knee immobilizer Activity Tolerance: Patient tolerated treatment well Patient left: in chair;with call bell/phone within reach        Time: 1443-1505 PT Time Calculation (min) (ACUTE ONLY): 22 min   Charges:   PT Evaluation $PT Eval Low Complexity: 1 Low     PT G CodesDrucilla Chalet:        Safari Cinque, PT Pager: [phone] 05/13/2017   [facility] At MidlandsWILLIAMS,Jenissa Tyrell 05/13/2017, 3:16 PM

## 2017-05-13 NOTE — Anesthesia Procedure Notes (Signed)
Spinal  Start time: 05/13/2017 8:14 AM End time: 05/13/2017 8:21 AM Staffing Resident/CRNA: Kizzie FantasiaARVER, Delaina Fetsch J Performed: resident/CRNA  Preanesthetic Checklist Completed: patient identified, site marked, surgical consent, pre-op evaluation, timeout performed, IV checked, risks and benefits discussed and monitors and equipment checked Spinal Block Patient position: sitting Prep: Betadine Patient monitoring: heart rate Approach: midline Location: L4-5 Injection technique: single-shot Needle Needle type: Pencan  Needle gauge: 24 G Needle length: 9 cm Needle insertion depth: 7 cm Additional Notes Pt sitting position, sterile prep and drape, negative paresthesia, heme.

## 2017-05-13 NOTE — H&P (View-Only) (Signed)
Kevin Carter DOB: 1951/07/18 Single / Language: [place] / Race: White Male Date of Admission:  05/13/2017 CC:  Right knee pain History of Present Illness  The patient is a 66 year old male who comes in  for a preoperative History and Physical. The patient is scheduled for a right total knee arthroplasty to be performed by Dr. Gus Rankin. Aluisio, MD at [facility] on 05-13-2017. The patient is a 66 year old male who presented for follow up of their knee. The patient is being followed for their right knee pain and osteoarthritis. They are now month(s) out from cortisone injection. Symptoms reported include: pain (medial side), pain at night, aching, grinding, giving way and pain with weightbearing. The patient feels that they are doing poorly and report their pain level to be moderate. The following medication has been used for pain control: none. The patient has not gotten any relief of their symptoms with Cortisone injections. The shots did not help much. Unfortunately his right knee pain is gone progressively worse over time. The injections are not working at all. He is at a stage now with the knee pain is essentially taking over his life. He is interested in proceeding with the knee replacement surgery. They have been treated conservatively in the past for the above stated problem and despite conservative measures, they continue to have progressive pain and severe functional limitations and dysfunction. They have failed non-operative management including home exercise, medications, and injections. It is felt that they would benefit from undergoing total joint replacement. Risks and benefits of the procedure have been discussed with the patient and they elect to proceed with surgery. There are no active contraindications to surgery such as ongoing infection or rapidly progressive neurological disease.  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)  Gastroesophageal Reflux Disease  Measles  Mumps  Hypertriglyceridemia   Allergies  No Known Drug Allergies  Family History  Cancer  father Diabetes Mellitus  father Hypertension  mother  Social History  Alcohol use  current drinker; drinks beer; 8-14 per week Children  0 Current work status  working full time Drug/Alcohol Rehab (Currently)  no Drug/Alcohol Rehab (Previously)  no Exercise  Exercises daily; does running / walking Illicit drug use  no Living situation  live alone Marital status  single Number of flights of stairs before winded  4-5 Pain Contract  no Tobacco / smoke exposure  no Tobacco use  former smoker; smoke(d) less than 1/2 pack(s) per day Post-Surgical Plans  Home With Family.  Medication History  Aspirin (81MG  Tablet, Oral) Active. Mucinex (Oral) Specific strength unknown - Active. Multi Vitamin (Oral) Active.  Past Surgical History  Straighten Nasal Septum  Date: 67. Colonoscopy  Date: 2016. 2005    Review of Systems General Present- Weight Loss. Not Present- Chills, Fatigue, Fever, Memory Loss, Night Sweats and Weight Gain. Skin Not Present- Eczema, Hives, Itching, Lesions and Rash. HEENT Not Present- Dentures, Double Vision, Headache, Hearing Loss, Tinnitus and Visual Loss. Respiratory Not Present- Allergies, Chronic Cough, Coughing up blood, Shortness of breath at rest and Shortness of breath with exertion. Cardiovascular Not Present- Chest Pain, Difficulty Breathing Lying Down, Murmur, Palpitations, Racing/skipping heartbeats and Swelling. Gastrointestinal Not Present- Abdominal Pain, Bloody Stool, Constipation, Diarrhea, Difficulty Swallowing, Heartburn, Jaundice, Loss of appetitie, Nausea and Vomiting. Male Genitourinary Present- Urinating at Night. Not Present- Blood in Urine, Discharge, Flank Pain, Incontinence, Painful Urination, Urgency,  Urinary frequency, Urinary Retention and Weak urinary stream. Musculoskeletal Present- Joint Pain and Leg Cramps. Not Present- Back Pain, Joint Swelling, Morning Stiffness, Muscle Pain, Muscle Weakness and Spasms. Neurological Present- Numbness (carpal tunnel). Not Present- Blackout spells, Difficulty with balance, Dizziness, Paralysis, Tremor and Weakness. Psychiatric Present- Change in Sleep Pattern. Not Present- Insomnia.  Vitals  Weight: 215 lb Height: 74in Weight was reported by patient. Height was reported by patient. Body Surface Area: 2.24 m Body Mass Index: 27.6 kg/m  Pulse: 60 (Regular)  BP: 130/64 (Sitting, Right Arm, Standard)     Physical Exa General Mental Status -Alert, cooperative and good historian. General Appearance-pleasant, Not in acute distress. Orientation-Oriented X3. Build & Nutrition-Well nourished and Well developed.  Head and Neck Head-normocephalic, atraumatic . Neck Global Assessment - supple, no bruit auscultated on the right, no bruit auscultated on the left.  Eye Pupil - Bilateral-Regular and Round. Motion - Bilateral-EOMI.  Chest and Lung Exam Auscultation Breath sounds - clear at anterior chest wall and clear at posterior chest wall. Adventitious sounds - No Adventitious sounds.  Cardiovascular Auscultation Rhythm - Regular rate and rhythm. Heart Sounds - S1 WNL and S2 WNL. Murmurs & Other Heart Sounds - Auscultation of the heart reveals - No Murmurs.  Abdomen Palpation/Percussion Tenderness - Abdomen is non-tender to palpation. Rigidity (guarding) - Abdomen is soft. Auscultation Auscultation of the abdomen reveals - Bowel sounds normal.  Male Genitourinary Note: Not done, not pertinent to present illness   Musculoskeletal Note: Evaluation of both hips shows normal range of motion. Left knee shows no effusion. Range of motion is 0-125. There is no tenderness or instability of the left knee. Right knee  shows varus deformity. Range 5to 120. He has tenderness along the medial aspect of the right knee. There is no lateral tenderness. There is no instability noted. Pulses sensation and motor are intact distally. Has an antalgic gait pattern on the right. Radiographs are reviewed AP and lateral of the knee has bone-on-bone medial and patellofemoral compartments.   Assessment & Plan  Primary osteoarthritis of right knee (M17.11)  Note:Surgical Plans: Right Total Knee Replacement  Disposition: Home with family  PCP: Dr. Benedetto GoadFred Wilson Cards: Dr. Allyson SabalBerry GI: Dr. Elnoria HowardHung  IV TXA  Anesthesia Issues: None  Patient was instructed on what medications to stop prior to surgery.  Signed electronically by Lauraine RinneAlexzandrew L Perkins, III PA-C

## 2017-05-13 NOTE — Discharge Instructions (Addendum)
° °Dr. Frank Aluisio °Total Joint Specialist °[facility] °[address] °[place], [place] [address] °[phone] ° °TOTAL KNEE REPLACEMENT POSTOPERATIVE DIRECTIONS ° °Knee Rehabilitation, Guidelines Following Surgery  °Results after knee surgery are often greatly improved when you follow the exercise, range of motion and muscle strengthening exercises prescribed by your doctor. Safety measures are also important to protect the knee from further injury. Any time any of these exercises cause you to have increased pain or swelling in your knee joint, decrease the amount until you are comfortable again and slowly increase them. If you have problems or questions, call your caregiver or physical therapist for advice.  ° °HOME CARE INSTRUCTIONS  °Remove items at home which could result in a fall. This includes throw rugs or furniture in walking pathways.  °· ICE to the affected knee every three hours for 30 minutes at a time and then as needed for pain and swelling.  Continue to use ice on the knee for pain and swelling from surgery. You may notice swelling that will progress down to the foot and ankle.  This is normal after surgery.  Elevate the leg when you are not up walking on it.   °· Continue to use the breathing machine which will help keep your temperature down.  It is common for your temperature to cycle up and down following surgery, especially at night when you are not up moving around and exerting yourself.  The breathing machine keeps your lungs expanded and your temperature down. °· Do not place pillow under knee, focus on keeping the knee straight while resting ° °DIET °You may resume your previous home diet once your are discharged from the hospital. ° °DRESSING / WOUND CARE / SHOWERING °You may shower 3 days after surgery, but keep the wounds dry during showering.  You may use an occlusive plastic wrap (Press'n Seal for example), NO SOAKING/SUBMERGING IN THE BATHTUB.  If the  bandage gets wet, change with a clean dry gauze.  If the incision gets wet, pat the wound dry with a clean towel. °You may start showering once you are discharged home but do not submerge the incision under water. Just pat the incision dry and apply a dry gauze dressing on daily. °Change the surgical dressing daily and reapply a dry dressing each time. ° °ACTIVITY °Walk with your walker as instructed. °Use walker as long as suggested by your caregivers. °Avoid periods of inactivity such as sitting longer than an hour when not asleep. This helps prevent blood clots.  °You may resume a sexual relationship in one month or when given the OK by your doctor.  °You may return to work once you are cleared by your doctor.  °Do not drive a car for 6 weeks or until released by you surgeon.  °Do not drive while taking narcotics. ° °WEIGHT BEARING °Weight bearing as tolerated with assist device (walker, cane, etc) as directed, use it as long as suggested by your surgeon or therapist, typically at least 4-6 weeks. ° °POSTOPERATIVE CONSTIPATION PROTOCOL °Constipation - defined medically as fewer than three stools per week and severe constipation as less than one stool per week. ° °One of the most common issues patients have following surgery is constipation.  Even if you have a regular bowel pattern at home, your normal regimen is likely to be disrupted due to multiple reasons following surgery.  Combination of anesthesia, postoperative narcotics, change in appetite and fluid intake all can affect your bowels.    In order to avoid complications following surgery, here are some recommendations in order to help you during your recovery period. ° °Colace (docusate) - Pick up an over-the-counter form of Colace or another stool softener and take twice a day as long as you are requiring postoperative pain medications.  Take with a full glass of water daily.  If you experience loose stools or diarrhea, hold the colace until you stool forms  back up.  If your symptoms do not get better within 1 week or if they get worse, check with your doctor. ° °Dulcolax (bisacodyl) - Pick up over-the-counter and take as directed by the product packaging as needed to assist with the movement of your bowels.  Take with a full glass of water.  Use this product as needed if not relieved by Colace only.  ° °MiraLax (polyethylene glycol) - Pick up over-the-counter to have on hand.  MiraLax is a solution that will increase the amount of water in your bowels to assist with bowel movements.  Take as directed and can mix with a glass of water, juice, soda, coffee, or tea.  Take if you go more than two days without a movement. °Do not use MiraLax more than once per day. Call your doctor if you are still constipated or irregular after using this medication for 7 days in a row. ° °If you continue to have problems with postoperative constipation, please contact the office for further assistance and recommendations.  If you experience "the worst abdominal pain ever" or develop nausea or vomiting, please contact the office immediatly for further recommendations for treatment. ° °ITCHING ° If you experience itching with your medications, try taking only a single pain pill, or even half a pain pill at a time.  You can also use Benadryl over the counter for itching or also to help with sleep.  ° °TED HOSE STOCKINGS °Wear the elastic stockings on both legs for three weeks following surgery during the day but you may remove then at night for sleeping. ° °MEDICATIONS °See your medication summary on the “After Visit Summary” that the nursing staff will review with you prior to discharge.  You may have some home medications which will be placed on hold until you complete the course of blood thinner medication.  It is important for you to complete the blood thinner medication as prescribed by your surgeon.  Continue your approved medications as instructed at time of  discharge. ° °PRECAUTIONS °If you experience chest pain or shortness of breath - call 911 immediately for transfer to the hospital emergency department.  °If you develop a fever greater that 101 F, purulent drainage from wound, increased redness or drainage from wound, foul odor from the wound/dressing, or calf pain - CONTACT YOUR SURGEON.   °                                                °FOLLOW-UP APPOINTMENTS °Make sure you keep all of your appointments after your operation with your surgeon and caregivers. You should call the office at the above phone number and make an appointment for approximately two weeks after the date of your surgery or on the date instructed by your surgeon outlined in the "After Visit Summary". ° ° °RANGE OF MOTION AND STRENGTHENING EXERCISES  °Rehabilitation of the knee is important following a knee injury or   an operation. After just a few days of immobilization, the muscles of the thigh which control the knee become weakened and shrink (atrophy). Knee exercises are designed to build up the tone and strength of the thigh muscles and to improve knee motion. Often times heat used for twenty to thirty minutes before working out will loosen up your tissues and help with improving the range of motion but do not use heat for the first two weeks following surgery. These exercises can be done on a training (exercise) mat, on the floor, on a table or on a bed. Use what ever works the best and is most comfortable for you Knee exercises include:  °Leg Lifts - While your knee is still immobilized in a splint or cast, you can do straight leg raises. Lift the leg to 60 degrees, hold for 3 sec, and slowly lower the leg. Repeat 10-20 times 2-3 times daily. Perform this exercise against resistance later as your knee gets better.  °Quad and Hamstring Sets - Tighten up the muscle on the front of the thigh (Quad) and hold for 5-10 sec. Repeat this 10-20 times hourly. Hamstring sets are done by pushing the  foot backward against an object and holding for 5-10 sec. Repeat as with quad sets.  °· Leg Slides: Lying on your back, slowly slide your foot toward your buttocks, bending your knee up off the floor (only go as far as is comfortable). Then slowly slide your foot back down until your leg is flat on the floor again. °· Angel Wings: Lying on your back spread your legs to the side as far apart as you can without causing discomfort.  °A rehabilitation program following serious knee injuries can speed recovery and prevent re-injury in the future due to weakened muscles. Contact your doctor or a physical therapist for more information on knee rehabilitation.  ° °IF YOU ARE TRANSFERRED TO A SKILLED REHAB FACILITY °If the patient is transferred to a skilled rehab facility following release from the hospital, a list of the current medications will be sent to the facility for the patient to continue.  When discharged from the skilled rehab facility, please have the facility set up the patient's Home Health Physical Therapy prior to being released. Also, the skilled facility will be responsible for providing the patient with their medications at time of release from the facility to include their pain medication, the muscle relaxants, and their blood thinner medication. If the patient is still at the rehab facility at time of the two week follow up appointment, the skilled rehab facility will also need to assist the patient in arranging follow up appointment in our office and any transportation needs. ° °MAKE SURE YOU:  °Understand these instructions.  °Get help right away if you are not doing well or get worse.  ° ° °Pick up stool softner and laxative for home use following surgery while on pain medications. °Do not submerge incision under water. °Please use good hand washing techniques while changing dressing each day. °May shower starting three days after surgery. °Please use a clean towel to pat the incision dry following  showers. °Continue to use ice for pain and swelling after surgery. °Do not use any lotions or creams on the incision until instructed by your surgeon. ° °Take Xarelto for two and a half more weeks following discharge from the hospital, then discontinue Xarelto. °Once the patient has completed the Xarelto, they may resume the 81 mg Aspirin. ° ° ° °Information on   my medicine - XARELTO® (Rivaroxaban) ° °This medication education was reviewed with me or my healthcare representative as part of my discharge preparation.  The pharmacist that spoke with me during my hospital stay was:  Legge, Justin Marshall, RPH ° °Why was Xarelto® prescribed for you? °Xarelto® was prescribed for you to reduce the risk of blood clots forming after orthopedic surgery. The medical term for these abnormal blood clots is venous thromboembolism (VTE). ° °What do you need to know about xarelto® ? °Take your Xarelto® ONCE DAILY at the same time every day. °You may take it either with or without food. ° °If you have difficulty swallowing the tablet whole, you may crush it and mix in applesauce just prior to taking your dose. ° °Take Xarelto® exactly as prescribed by your doctor and DO NOT stop taking Xarelto® without talking to the doctor who prescribed the medication.  Stopping without other VTE prevention medication to take the place of Xarelto® may increase your risk of developing a clot. ° °After discharge, you should have regular check-up appointments with your healthcare provider that is prescribing your Xarelto®.   ° °What do you do if you miss a dose? °If you miss a dose, take it as soon as you remember on the same day then continue your regularly scheduled once daily regimen the next day. Do not take two doses of Xarelto® on the same day.  ° °Important Safety Information °A possible side effect of Xarelto® is bleeding. You should call your healthcare provider right away if you experience any of the following: °? Bleeding from an injury or  your nose that does not stop. °? Unusual colored urine (red or dark brown) or unusual colored stools (red or black). °? Unusual bruising for unknown reasons. °? A serious fall or if you hit your head (even if there is no bleeding). ° °Some medicines may interact with Xarelto® and might increase your risk of bleeding while on Xarelto®. To help avoid this, consult your healthcare provider or pharmacist prior to using any new prescription or non-prescription medications, including herbals, vitamins, non-steroidal anti-inflammatory drugs (NSAIDs) and supplements. ° °This website has more information on Xarelto®: www.xarelto.com. ° ° ° °

## 2017-05-13 NOTE — Interval H&P Note (Signed)
History and Physical Interval Note:  05/13/2017 6:33 AM  Kevin Carter  has presented today for surgery, with the diagnosis of Osteoarthritis Right Knee  The various methods of treatment have been discussed with the patient and family. After consideration of risks, benefits and other options for treatment, the patient has consented to  Procedure(s): RIGHT TOTAL KNEE ARTHROPLASTY (Right) as a surgical intervention .  The patient's history has been reviewed, patient examined, no change in status, stable for surgery.  I have reviewed the patient's chart and labs.  Questions were answered to the patient's satisfaction.     Loanne DrillingALUISIO,Evo Aderman V

## 2017-05-13 NOTE — Anesthesia Preprocedure Evaluation (Addendum)
Anesthesia Evaluation  Patient identified by MRN, date of birth, ID band Patient awake    Reviewed: Allergy & Precautions, NPO status , Patient's Chart, lab work & pertinent test results  Airway Mallampati: I       Dental no notable dental hx.    Pulmonary former smoker,    breath sounds clear to auscultation       Cardiovascular negative cardio ROS   Rhythm:Regular Rate:Normal     Neuro/Psych  Neuromuscular disease    GI/Hepatic negative GI ROS, Neg liver ROS,   Endo/Other  negative endocrine ROS  Renal/GU negative Renal ROS     Musculoskeletal  (+) Arthritis ,   Abdominal   Peds  Hematology   Anesthesia Other Findings   Reproductive/Obstetrics                            Anesthesia Physical Anesthesia Plan  ASA: II  Anesthesia Plan: Spinal and Regional   Post-op Pain Management:  Regional for Post-op pain   Induction:   PONV Risk Score and Plan: 1 and Ondansetron and Dexamethasone  Airway Management Planned: Natural Airway and Simple Face Mask  Additional Equipment:   Intra-op Plan:   Post-operative Plan:   Informed Consent: I have reviewed the patients History and Physical, chart, labs and discussed the procedure including the risks, benefits and alternatives for the proposed anesthesia with the patient or authorized representative who has indicated his/her understanding and acceptance.   Dental advisory given  Plan Discussed with: CRNA  Anesthesia Plan Comments:         Anesthesia Quick Evaluation

## 2017-05-13 NOTE — Op Note (Signed)
OPERATIVE REPORT-TOTAL KNEE ARTHROPLASTY   Pre-operative diagnosis- Osteoarthritis  Right knee(s)  Post-operative diagnosis- Osteoarthritis Right knee(s)  Procedure-  Right  Total Knee Arthroplasty  Surgeon- Gus Rankin. Ezriel Boffa, MD  Assistant- Avel Peace, PA-C   Anesthesia-  GA combined with regional for post-op pain  EBL-25 mL   Drains Hemovac  Tourniquet time-  Total Tourniquet Time Documented: Thigh (Right) - 33 minutes Total: Thigh (Right) - 33 minutes     Complications- None  Condition-PACU - hemodynamically stable.   Brief Clinical Note  Kevin Carter is a 66 y.o. year old male with end stage OA of his right knee with progressively worsening pain and dysfunction. He has constant pain, with activity and at rest and significant functional deficits with difficulties even with ADLs. He has had extensive non-op management including analgesics, injections of cortisone and viscosupplements, and home exercise program, but remains in significant pain with significant dysfunction. Radiographs show bone on bone arthritis medial and patellofemoral. He presents now for right Total Knee Arthroplasty.    Procedure in detail---   The patient is brought into the operating room and positioned supine on the operating table. After successful administration of  GA combined with regional for post-op pain,   a tourniquet is placed high on the  Right thigh(s) and the lower extremity is prepped and draped in the usual sterile fashion. Time out is performed by the operating team and then the  Right lower extremity is wrapped in Esmarch, knee flexed and the tourniquet inflated to 300 mmHg.       A midline incision is made with a ten blade through the subcutaneous tissue to the level of the extensor mechanism. A fresh blade is used to make a medial parapatellar arthrotomy. Soft tissue over the proximal medial tibia is subperiosteally elevated to the joint line with a knife and into the  semimembranosus bursa with a Cobb elevator. Soft tissue over the proximal lateral tibia is elevated with attention being paid to avoiding the patellar tendon on the tibial tubercle. The patella is everted, knee flexed 90 degrees and the ACL and PCL are removed. Findings are bone on bone medial and patellofemoral with large global osteophytes.        The drill is used to create a starting hole in the distal femur and the canal is thoroughly irrigated with sterile saline to remove the fatty contents. The 5 degree Right  valgus alignment guide is placed into the femoral canal and the distal femoral cutting block is pinned to remove 9 mm off the distal femur. Resection is made with an oscillating saw.      The tibia is subluxed forward and the menisci are removed. The extramedullary alignment guide is placed referencing proximally at the medial aspect of the tibial tubercle and distally along the second metatarsal axis and tibial crest. The block is pinned to remove 2mm off the more deficient medial  side. Resection is made with an oscillating saw. Size 8is the most appropriate size for the tibia and the proximal tibia is prepared with the modular drill and keel punch for that size.      The femoral sizing guide is placed and size 8 is most appropriate. Rotation is marked off the epicondylar axis and confirmed by creating a rectangular flexion gap at 90 degrees. The size 8 cutting block is pinned in this rotation and the anterior, posterior and chamfer cuts are made with the oscillating saw. The intercondylar block is then placed and that cut  is made.      Trial size 8 tibial component, trial size 8 posterior stabilized femur and a 8  mm posterior stabilized rotating platform insert trial is placed. Full extension is achieved with excellent varus/valgus and anterior/posterior balance throughout full range of motion. The patella is everted and thickness measured to be 27  mm. Free hand resection is taken to 15 mm, a  41 template is placed, lug holes are drilled, trial patella is placed, and it tracks normally. Osteophytes are removed off the posterior femur with the trial in place. All trials are removed and the cut bone surfaces prepared with pulsatile lavage. Cement is mixed and once ready for implantation, the size 8 tibial implant, size  8 posterior stabilized femoral component, and the size 41 patella are cemented in place and the patella is held with the clamp. The trial insert is placed and the knee held in full extension. The Exparel (20 ml mixed with 60 ml saline) is injected into the extensor mechanism, posterior capsule, medial and lateral gutters and subcutaneous tissues.  All extruded cement is removed and once the cement is hard the permanent 8 mm posterior stabilized rotating platform insert is placed into the tibial tray.      The wound is copiously irrigated with saline solution and the extensor mechanism closed over a hemovac drain with #1 V-loc suture. The tourniquet is released for a total tourniquet time of 33  minutes. Flexion against gravity is 140 degrees and the patella tracks normally. Subcutaneous tissue is closed with 2.0 vicryl and subcuticular with running 4.0 Monocryl. The incision is cleaned and dried and steri-strips and a bulky sterile dressing are applied. The limb is placed into a knee immobilizer and the patient is awakened and transported to recovery in stable condition.      Please note that a surgical assistant was a medical necessity for this procedure in order to perform it in a safe and expeditious manner. Surgical assistant was necessary to retract the ligaments and vital neurovascular structures to prevent injury to them and also necessary for proper positioning of the limb to allow for anatomic placement of the prosthesis.   Gus RankinFrank V. Matthias Bogus, MD    05/13/2017, 9:22 AM

## 2017-05-13 NOTE — Anesthesia Procedure Notes (Signed)
Procedure Name: LMA Insertion Performed by: Kizzie FantasiaARVER, Andra Matsuo J Pre-anesthesia Checklist: Patient identified, Emergency Drugs available, Suction available, Patient being monitored and Timeout performed Patient Re-evaluated:Patient Re-evaluated prior to induction Oxygen Delivery Method: Circle system utilized Preoxygenation: Pre-oxygenation with 100% oxygen Induction Type: IV induction Ventilation: Mask ventilation without difficulty Number of attempts: 1 Tube secured with: Tape Dental Injury: Teeth and Oropharynx as per pre-operative assessment

## 2017-05-13 NOTE — Progress Notes (Signed)
AssistedDr. Massagee with right, ultrasound guided, adductor canal block. Side rails up, monitors on throughout procedure. See vital signs in flow sheet. Tolerated Procedure well.  

## 2017-05-13 NOTE — Transfer of Care (Signed)
Immediate Anesthesia Transfer of Care Note  Patient: Kevin KollerWilliam Grandison  Procedure(s) Performed: RIGHT TOTAL KNEE ARTHROPLASTY (Right Knee)  Patient Location: PACU  Anesthesia Type:General  Level of Consciousness: sedated, patient cooperative and responds to stimulation  Airway & Oxygen Therapy: Patient Spontanous Breathing and Patient connected to face mask oxygen  Post-op Assessment: Report given to RN and Post -op Vital signs reviewed and stable  Post vital signs: Reviewed and stable  Last Vitals:  Vitals:   05/13/17 0758 05/13/17 0759  BP: (!) 115/57   Pulse: (!) 58 (!) 54  Resp:    Temp:    SpO2: 93% 95%    Last Pain:  Vitals:   05/13/17 0643  TempSrc: Oral      Patients Stated Pain Goal: 3 (05/13/17 [id number])  Complications: No apparent anesthesia complications

## 2017-05-14 LAB — TYPE AND SCREEN
ABO/RH(D): B NEG
ANTIBODY SCREEN: NEGATIVE

## 2017-05-14 LAB — CBC
HEMATOCRIT: 34.3 % — AB (ref 39.0–52.0)
HEMOGLOBIN: 11.7 g/dL — AB (ref 13.0–17.0)
MCH: 30.6 pg (ref 26.0–34.0)
MCHC: 34.1 g/dL (ref 30.0–36.0)
MCV: 89.8 fL (ref 78.0–100.0)
Platelets: 201 10*3/uL (ref 150–400)
RBC: 3.82 MIL/uL — AB (ref 4.22–5.81)
RDW: 13.1 % (ref 11.5–15.5)
WBC: 11.6 10*3/uL — AB (ref 4.0–10.5)

## 2017-05-14 LAB — BASIC METABOLIC PANEL
ANION GAP: 7 (ref 5–15)
BUN: 18 mg/dL (ref 6–20)
CHLORIDE: 104 mmol/L (ref 101–111)
CO2: 25 mmol/L (ref 22–32)
Calcium: 8.3 mg/dL — ABNORMAL LOW (ref 8.9–10.3)
Creatinine, Ser: 0.63 mg/dL (ref 0.61–1.24)
GFR calc non Af Amer: >60 mL/min (ref >60)
Glucose, Bld: 100 mg/dL — ABNORMAL HIGH (ref 65–99)
POTASSIUM: 3.5 mmol/L (ref 3.5–5.1)
Sodium: 136 mmol/L (ref 135–145)

## 2017-05-14 MED ORDER — METHOCARBAMOL 500 MG PO TABS: 500.0000 mg | ORAL_TABLET | Freq: Four times a day (QID) | ORAL | 0 refills | Status: DC | PRN

## 2017-05-14 MED ORDER — TRAMADOL HCL 50 MG PO TABS: 50.0000 mg | ORAL_TABLET | Freq: Four times a day (QID) | ORAL | 0 refills | Status: DC | PRN

## 2017-05-14 MED ORDER — OXYCODONE HCL 5 MG PO TABS: 5.0000 mg | ORAL_TABLET | ORAL | 0 refills | Status: DC | PRN

## 2017-05-14 MED ORDER — RIVAROXABAN 10 MG PO TABS: 10.0000 mg | ORAL_TABLET | Freq: Every day | ORAL | 0 refills | Status: DC

## 2017-05-14 NOTE — Progress Notes (Signed)
Spoke with patient at bedside, states he plans to go to OP PT after d/c, also has HEP to follow prior to OP starting. States he has a 3n1 but needs a RW and shower stool. Contacted [facility] to deliver to the room. [phone]

## 2017-05-14 NOTE — Anesthesia Postprocedure Evaluation (Signed)
Anesthesia Post Note  Patient: Kevin KollerWilliam Carter  Procedure(s) Performed: RIGHT TOTAL KNEE ARTHROPLASTY (Right Knee)     Patient location during evaluation: PACU Anesthesia Type: Regional and Spinal Level of consciousness: oriented and awake and alert Pain management: pain level controlled Vital Signs Assessment: post-procedure vital signs reviewed and stable Respiratory status: spontaneous breathing, respiratory function stable and patient connected to nasal cannula oxygen Cardiovascular status: blood pressure returned to baseline and stable Postop Assessment: no headache, no backache and no apparent nausea or vomiting Anesthetic complications: no    Last Vitals:  Vitals:   05/14/17 0953 05/14/17 1422  BP: 107/60 125/62  Pulse: 66 67  Resp: 17 16  Temp:  36.7 C  SpO2: 99% 99%    Last Pain:  Vitals:   05/14/17 1422  TempSrc: Oral  PainSc:                  [name],[name]

## 2017-05-14 NOTE — Discharge Summary (Signed)
Physician Discharge Summary   Patient ID: Kevin Carter MRN: [id number] DOB/AGE: Mar 04, 1951 66 y.o.  Admit date: 05/13/2017 Discharge date: 05/15/2017  Primary Diagnosis:  Osteoarthritis Right knee(s)   Admission Diagnoses:  Past Medical History:  Diagnosis Date  . Carpal tunnel syndrome 2018  . Hypertriglyceridemia   . Pre-diabetes   . Prediabetes    Discharge Diagnoses:   Principal Problem:   OA (osteoarthritis) of knee  Estimated body mass index is 27.97 kg/m as calculated from the following:   Height as of this encounter: '6\' 1"'  (1.854 m).   Weight as of this encounter: 96.2 kg (212 lb).  Procedure:  Procedure(s) (LRB): RIGHT TOTAL KNEE ARTHROPLASTY (Right)   Consults: None  HPI: Kevin Carter is a 66 y.o. year old male with end stage OA of his right knee with progressively worsening pain and dysfunction. He has constant pain, with activity and at rest and significant functional deficits with difficulties even with ADLs. He has had extensive non-op management including analgesics, injections of cortisone and viscosupplements, and home exercise program, but remains in significant pain with significant dysfunction. Radiographs show bone on bone arthritis medial and patellofemoral. He presents now for right Total Knee Arthroplasty.    Laboratory Data: Admission on 05/13/2017  Component Date Value Ref Range Status  . WBC 05/14/2017 11.6* 4.0 - 10.5 K/uL Final  . RBC 05/14/2017 3.82* 4.22 - 5.81 MIL/uL Final  . Hemoglobin 05/14/2017 11.7* 13.0 - 17.0 g/dL Final  . HCT 05/14/2017 34.3* 39.0 - 52.0 % Final  . MCV 05/14/2017 89.8  78.0 - 100.0 fL Final  . MCH 05/14/2017 30.6  26.0 - 34.0 pg Final  . MCHC 05/14/2017 34.1  30.0 - 36.0 g/dL Final  . RDW 05/14/2017 13.1  11.5 - 15.5 % Final  . Platelets 05/14/2017 201  150 - 400 K/uL Final  . Sodium 05/14/2017 136  135 - 145 mmol/L Final  . Potassium 05/14/2017 3.5  3.5 - 5.1 mmol/L Final  . Chloride 05/14/2017 104  101  - 111 mmol/L Final  . CO2 05/14/2017 25  22 - 32 mmol/L Final  . Glucose, Bld 05/14/2017 100* 65 - 99 mg/dL Final  . BUN 05/14/2017 18  6 - 20 mg/dL Final  . Creatinine, Ser 05/14/2017 0.63  0.61 - 1.24 mg/dL Final  . Calcium 05/14/2017 8.3* 8.9 - 10.3 mg/dL Final  . GFR calc non Af Amer 05/14/2017 >60  >60 mL/min Final  . GFR calc Af Amer 05/14/2017 >60  >60 mL/min Final   Comment: (NOTE) The eGFR has been calculated using the CKD EPI equation. This calculation has not been validated in all clinical situations. eGFR's persistently <60 mL/min signify possible Chronic Kidney Disease.   Georgiann Hahn gap 05/14/2017 7  5 - 15 Final  Hospital Outpatient Visit on 05/06/2017  Component Date Value Ref Range Status  . aPTT 05/06/2017 31  24 - 36 seconds Final  . WBC 05/06/2017 6.0  4.0 - 10.5 K/uL Final  . RBC 05/06/2017 4.73  4.22 - 5.81 MIL/uL Final  . Hemoglobin 05/06/2017 14.4  13.0 - 17.0 g/dL Final  . HCT 05/06/2017 41.8  39.0 - 52.0 % Final  . MCV 05/06/2017 88.4  78.0 - 100.0 fL Final  . MCH 05/06/2017 30.4  26.0 - 34.0 pg Final  . MCHC 05/06/2017 34.4  30.0 - 36.0 g/dL Final  . RDW 05/06/2017 12.9  11.5 - 15.5 % Final  . Platelets 05/06/2017 225  150 - 400 K/uL Final  .  Sodium 05/06/2017 135  135 - 145 mmol/L Final  . Potassium 05/06/2017 4.2  3.5 - 5.1 mmol/L Final  . Chloride 05/06/2017 101  101 - 111 mmol/L Final  . CO2 05/06/2017 25  22 - 32 mmol/L Final  . Glucose, Bld 05/06/2017 119* 65 - 99 mg/dL Final  . BUN 05/06/2017 19  6 - 20 mg/dL Final  . Creatinine, Ser 05/06/2017 0.70  0.61 - 1.24 mg/dL Final  . Calcium 05/06/2017 9.2  8.9 - 10.3 mg/dL Final  . Total Protein 05/06/2017 7.4  6.5 - 8.1 g/dL Final  . Albumin 05/06/2017 4.2  3.5 - 5.0 g/dL Final  . AST 05/06/2017 21  15 - 41 U/L Final  . ALT 05/06/2017 23  17 - 63 U/L Final  . Alkaline Phosphatase 05/06/2017 58  38 - 126 U/L Final  . Total Bilirubin 05/06/2017 0.6  0.3 - 1.2 mg/dL Final  . GFR calc non Af Amer  05/06/2017 >60  >60 mL/min Final  . GFR calc Af Amer 05/06/2017 >60  >60 mL/min Final   Comment: (NOTE) The eGFR has been calculated using the CKD EPI equation. This calculation has not been validated in all clinical situations. eGFR's persistently <60 mL/min signify possible Chronic Kidney Disease.   . Anion gap 05/06/2017 9  5 - 15 Final  . Prothrombin Time 05/06/2017 12.9  11.4 - 15.2 seconds Final  . INR 05/06/2017 0.98   Final  . ABO/RH(D) 05/06/2017 B NEG   Final  . Antibody Screen 05/06/2017 NEG   Final  . Sample Expiration 05/06/2017 05/16/2017   Final  . Extend sample reason 05/06/2017 NO TRANSFUSIONS OR PREGNANCY IN THE PAST 3 MONTHS   Final  . Hgb A1c MFr Bld 05/06/2017 5.5  4.8 - 5.6 % Final   Comment: (NOTE) Pre diabetes:          5.7%-6.4% Diabetes:              >6.4% Glycemic control for   <7.0% adults with diabetes   . Mean Plasma Glucose 05/06/2017 111.15  mg/dL Final   Performed at [facility] [address], [place], [place] [address]  . MRSA, PCR 05/06/2017 NEGATIVE  NEGATIVE Final  . Staphylococcus aureus 05/06/2017 NEGATIVE  NEGATIVE Final   Comment: (NOTE) The Xpert SA Assay (FDA approved for NASAL specimens in patients 20 years of age and older), is one component of a comprehensive surveillance program. It is not intended to diagnose infection nor to guide or monitor treatment.   . ABO/RH(D) 05/06/2017 B NEG   Final     X-Rays:No results found.  EKG: Orders placed or performed in visit on 05/03/17  . EKG 12-Lead     Hospital Course: Kevin Carter is a 66 y.o. who was admitted to [facility]. They were brought to the operating room on 05/13/2017 and underwent Procedure(s): RIGHT TOTAL KNEE ARTHROPLASTY.  Patient tolerated the procedure well and was later transferred to the recovery room and then to the orthopaedic floor for postoperative care.  They were given PO and IV analgesics for pain control following their surgery.  They  were given 24 hours of postoperative antibiotics of  Anti-infectives    Start     Dose/Rate Route Frequency Ordered Stop   05/13/17 1400  ceFAZolin (ANCEF) IVPB 2g/100 mL premix     2 g 200 mL/hr over 30 Minutes Intravenous Every 6 hours 05/13/17 1111 05/13/17 2139   05/13/17 0640  ceFAZolin (ANCEF) 2-4 GM/100ML-%  IVPB    Comments:  Mardelle Matte   : cabinet override      05/13/17 0640 05/13/17 0828   05/13/17 0633  ceFAZolin (ANCEF) IVPB 2g/100 mL premix     2 g 200 mL/hr over 30 Minutes Intravenous On call to O.R. 05/13/17 3825 05/13/17 0858     and started on DVT prophylaxis in the form of Xarelto.   PT and OT were ordered for total joint protocol.  Discharge planning consulted to help with postop disposition and equipment needs.  Patient had a decent night on the evening of surgery.  They started to get up OOB with therapy on day one. Hemovac drain was pulled without difficulty.  Continued to work with therapy into day two.  Dressing was changed on day two and the incision was healing well. Patient was seen in rounds on day two and was ready to go home.   Diet: Cardiac diet and Diabetic diet Activity:WBAT Follow-up:in 2 weeks Disposition - Home Discharged Condition: stable   Discharge Instructions    Call MD / Call 911    Complete by:  As directed    If you experience chest pain or shortness of breath, CALL 911 and be transported to the hospital emergency room.  If you develope a fever above 101 F, pus (white drainage) or increased drainage or redness at the wound, or calf pain, call your surgeon's office.   Change dressing    Complete by:  As directed    Change dressing daily with sterile 4 x 4 inch gauze dressing and apply TED hose. Do not submerge the incision under water.   Constipation Prevention    Complete by:  As directed    Drink plenty of fluids.  Prune juice may be helpful.  You may use a stool softener, such as Colace (over the counter) 100 mg twice a day.  Use  MiraLax (over the counter) for constipation as needed.   Diet - low sodium heart healthy    Complete by:  As directed    Discharge instructions    Complete by:  As directed    Take Xarelto for two and a half more weeks, then discontinue Xarelto. Once the patient has completed the Xarelto, they may resume the 81 mg Aspirin.  Pick up stool softner and laxative for home use following surgery while on pain medications. Do not submerge incision under water. Please use good hand washing techniques while changing dressing each day. May shower starting three days after surgery. Please use a clean towel to pat the incision dry following showers. Continue to use ice for pain and swelling after surgery. Do not use any lotions or creams on the incision until instructed by your surgeon.  Wear both TED hose on both legs during the day every day for three weeks, but may remove the TED hose at night at home.  Postoperative Constipation Protocol  Constipation - defined medically as fewer than three stools per week and severe constipation as less than one stool per week.  One of the most common issues patients have following surgery is constipation.  Even if you have a regular bowel pattern at home, your normal regimen is likely to be disrupted due to multiple reasons following surgery.  Combination of anesthesia, postoperative narcotics, change in appetite and fluid intake all can affect your bowels.  In order to avoid complications following surgery, here are some recommendations in order to help you during your recovery period.  Colace (docusate) -  Pick up an over-the-counter form of Colace or another stool softener and take twice a day as long as you are requiring postoperative pain medications.  Take with a full glass of water daily.  If you experience loose stools or diarrhea, hold the colace until you stool forms back up.  If your symptoms do not get better within 1 week or if they get worse, check with  your doctor.  Dulcolax (bisacodyl) - Pick up over-the-counter and take as directed by the product packaging as needed to assist with the movement of your bowels.  Take with a full glass of water.  Use this product as needed if not relieved by Colace only.   MiraLax (polyethylene glycol) - Pick up over-the-counter to have on hand.  MiraLax is a solution that will increase the amount of water in your bowels to assist with bowel movements.  Take as directed and can mix with a glass of water, juice, soda, coffee, or tea.  Take if you go more than two days without a movement. Do not use MiraLax more than once per day. Call your doctor if you are still constipated or irregular after using this medication for 7 days in a row.  If you continue to have problems with postoperative constipation, please contact the office for further assistance and recommendations.  If you experience "the worst abdominal pain ever" or develop nausea or vomiting, please contact the office immediatly for further recommendations for treatment.   Do not put a pillow under the knee. Place it under the heel.    Complete by:  As directed    Do not sit on low chairs, stoools or toilet seats, as it may be difficult to get up from low surfaces    Complete by:  As directed    Driving restrictions    Complete by:  As directed    No driving until released by the physician.   Increase activity slowly as tolerated    Complete by:  As directed    Lifting restrictions    Complete by:  As directed    No lifting until released by the physician.   Patient may shower    Complete by:  As directed    You may shower without a dressing once there is no drainage.  Do not wash over the wound.  If drainage remains, do not shower until drainage stops.   TED hose    Complete by:  As directed    Use stockings (TED hose) for 3 weeks on both leg(s).  You may remove them at night for sleeping.   Weight bearing as tolerated    Complete by:  As  directed    Laterality:  right   Extremity:  Lower     Allergies as of 05/14/2017   No Known Allergies     Medication List    STOP taking these medications   aspirin EC 81 MG tablet   MULTIVITAMIN PO     TAKE these medications   methocarbamol 500 MG tablet Commonly known as:  ROBAXIN Take 1 tablet (500 mg total) by mouth every 6 (six) hours as needed for muscle spasms.   oxyCODONE 5 MG immediate release tablet Commonly known as:  Oxy IR/ROXICODONE Take 1-2 tablets (5-10 mg total) by mouth every 4 (four) hours as needed for moderate pain or severe pain.   rivaroxaban 10 MG Tabs tablet Commonly known as:  XARELTO Take 1 tablet (10 mg total) by mouth daily with breakfast.  Take Xarelto for two and a half more weeks following discharge from the hospital, then discontinue Xarelto. Once the patient has completed the Xarelto, they may resume the 81 mg Aspirin.   traMADol 50 MG tablet Commonly known as:  ULTRAM Take 1-2 tablets (50-100 mg total) by mouth every 6 (six) hours as needed (mild pain).   VISINE OP Place 2 drops into both eyes as needed (for dry eyes).            Durable Medical Equipment        Start     Ordered   05/14/17 1155  For home use only DME Shower stool  Once     05/14/17 1154   05/14/17 1154  For home use only DME Walker rolling  Once    Question:  Patient needs a walker to treat with the following condition  Answer:  S/P knee replacement   05/14/17 1154       Discharge Care Instructions        Start     Ordered   05/14/17 0000  Weight bearing as tolerated    Question Answer Comment  Laterality right   Extremity Lower      05/14/17 2115   05/14/17 0000  Change dressing    Comments:  Change dressing daily with sterile 4 x 4 inch gauze dressing and apply TED hose. Do not submerge the incision under water.   05/14/17 2115     Follow-up Information    [facility] Follow up.   Why:  walker and bedside  commode Contact information: [address] [place] [address] [phone]        Gaynelle Arabian, MD. Schedule an appointment as soon as possible for a visit on 05/28/2017.   Specialty:  Orthopedic Surgery Contact information: [address] [place] [address] [phone]           Signed: Arlee Muslim, PA-C Orthopaedic Surgery 05/14/2017, 9:16 PM

## 2017-05-14 NOTE — Progress Notes (Signed)
   Subjective: 1 Day Post-Op Procedure(s) (LRB): RIGHT TOTAL KNEE ARTHROPLASTY (Right) Patient reports pain as mild and moderate.   Patient seen in rounds for Dr. Lequita HaltAluisio. Patient is well, but has had some minor complaints of pain in the knee, requiring pain medications We will start therapy today.  Plan is to go Home after hospital stay.  Objective: Vital signs in last 24 hours: Temp:  [97.5 F (36.4 C)-98.3 F (36.8 C)] 97.7 F (36.5 C) (10/23 0617) Pulse Rate:  [55-67] 66 (10/23 0953) Resp:  [16-18] 17 (10/23 0953) BP: (104-128)/(52-66) 107/60 (10/23 0953) SpO2:  [95 %-100 %] 99 % (10/23 0953)  Intake/Output from previous day:  Intake/Output Summary (Last 24 hours) at 05/14/17 1137 Last data filed at 05/14/17 0950  Gross per 24 hour  Intake          3581.67 ml  Output             1780 ml  Net          1801.67 ml    Intake/Output this shift: Total I/O In: 480 [P.O.:480] Out: -   Labs:  Recent Labs  05/14/17 0510  HGB 11.7*    Recent Labs  05/14/17 0510  WBC 11.6*  RBC 3.82*  HCT 34.3*  PLT 201    Recent Labs  05/14/17 0510  NA 136  K 3.5  CL 104  CO2 25  BUN 18  CREATININE 0.63  GLUCOSE 100*  CALCIUM 8.3*   No results for input(s): LABPT, INR in the last 72 hours.  EXAM General - Patient is Alert, Appropriate and Oriented Extremity - Neurovascular intact Sensation intact distally Intact pulses distally Dorsiflexion/Plantar flexion intact Dressing - dressing C/D/I Motor Function - intact, moving foot and toes well on exam.  Hemovac pulled without difficulty.  Past Medical History:  Diagnosis Date  . Carpal tunnel syndrome 2018  . Hypertriglyceridemia   . Pre-diabetes   . Prediabetes     Assessment/Plan: 1 Day Post-Op Procedure(s) (LRB): RIGHT TOTAL KNEE ARTHROPLASTY (Right) Principal Problem:   OA (osteoarthritis) of knee  Estimated body mass index is 27.97 kg/m as calculated from the following:   Height as of this  encounter: 6\' 1"  (1.854 m).   Weight as of this encounter: 96.2 kg (212 lb). Up with therapy  DVT Prophylaxis - Xarelto Weight-Bearing as tolerated to right leg D/C O2 and Pulse OX and try on Room Air  Avel Peacerew Julaine Zimny, PA-C Orthopaedic Surgery 05/14/2017, 11:37 AM

## 2017-05-14 NOTE — Progress Notes (Signed)
Physical Therapy Treatment Patient Details Name: Kevin KollerWilliam Carter MRN: [id number] DOB: 01/30/1951 Today's Date: 05/14/2017    History of Present Illness s/p R TKA    PT Comments    The  Patient ambulated x 6790' with RW. Tolerated There exercise. Patient reports that he plans to go to OP PT.   Follow Up Recommendations  DC plan and follow up therapy as arranged by surgeon;Outpatient PT     Equipment Recommendations  Rolling walker with 5" wheels;3in1 (PT)    Recommendations for Other Services       Precautions / Restrictions Precautions Precautions: Fall;Knee Required Braces or Orthoses: Knee Immobilizer - Right Knee Immobilizer - Right: Discontinue once straight leg raise with < 10 degree lag Restrictions Other Position/Activity Restrictions: WBAT    Mobility  Bed Mobility Overal bed mobility: Needs Assistance Bed Mobility: Sit to Supine     Supine to sit: Min assist     General bed mobility comments: self assist the  rightl with left  Transfers Overall transfer level: Needs assistance Equipment used: Rolling walker (2 wheeled) Transfers: Sit to/from Stand Sit to Stand: Min guard         General transfer comment: cues for hand placement and RLE position  Ambulation/Gait Ambulation/Gait assistance: Min guard Ambulation Distance (Feet): 90 Feet Assistive device: Rolling walker (2 wheeled) Gait Pattern/deviations: Step-to pattern;Step-through pattern;Antalgic         Stairs            Wheelchair Mobility    Modified Rankin (Stroke Patients Only)       Balance                                            Cognition Arousal/Alertness: Awake/alert Behavior During Therapy: WFL for tasks assessed/performed Overall Cognitive Status: Within Functional Limits for tasks assessed                                        Exercises Total Joint Exercises Ankle Circles/Pumps: AROM;Both;10 reps Quad Sets: AROM;Both;10  reps Heel Slides: AAROM;Right;10 reps Hip ABduction/ADduction: AAROM;Right;10 reps Straight Leg Raises: AAROM;Right;10 reps    General Comments        Pertinent Vitals/Pain Pain Score: 3  Pain Location: right knee Pain Descriptors / Indicators: Aching;Sore Pain Intervention(s): Limited activity within patient's tolerance;Monitored during session;Premedicated before session;Repositioned;Ice applied    Home Living Family/patient expects to be discharged to:: Private residence Living Arrangements: Parent;Other (Comment)           Home Equipment: Bedside commode Additional Comments: plans to get a shower seat    Prior Function Level of Independence: Independent          PT Goals (current goals can now be found in the care plan section) Acute Rehab PT Goals Patient Stated Goal: return to riding motorcycles Progress towards PT goals: Progressing toward goals    Frequency    7X/week      PT Plan Current plan remains appropriate    Co-evaluation              AM-PAC PT "6 Clicks" Daily Activity  Outcome Measure  Difficulty turning over in bed (including adjusting bedclothes, sheets and blankets)?: A Little Difficulty moving from lying on back to sitting on the side of the bed? : A Little  Difficulty sitting down on and standing up from a chair with arms (e.g., wheelchair, bedside commode, etc,.)?: A Little Help needed moving to and from a bed to chair (including a wheelchair)?: A Little Help needed walking in hospital room?: A Little Help needed climbing 3-5 steps with a railing? : A Lot 6 Click Score: 17    End of Session Equipment Utilized During Treatment: Right knee immobilizer Activity Tolerance: Patient tolerated treatment well Patient left: in bed;with call bell/phone within reach Nurse Communication: Mobility status PT Visit Diagnosis: Difficulty in walking, not elsewhere classified (R26.2)     Time: 9604-5409 PT Time Calculation (min) (ACUTE  ONLY): 32 min  Charges:  $Gait Training: 8-22 mins $Therapeutic Exercise: 8-22 mins                    G CodesBlanchard Kelch PT [phone]    Rada Hay 05/14/2017, 12:54 PM

## 2017-05-14 NOTE — Evaluation (Signed)
Occupational Therapy Evaluation Patient Details Name: Kevin Carter MRN: [id number] DOB: Jul 21, 1951 Today's Date: 05/14/2017    History of Present Illness s/p R TKA   Clinical Impression   This 66 year old man was admitted for the above.  Will follow in acute for one more session to further educate on bathroom transfers.  He plans to discharge to his mother's home initially.    Follow Up Recommendations  No OT follow up;Supervision/Assistance - 24 hour    Equipment Recommendations  None recommended by OT    Recommendations for Other Services       Precautions / Restrictions Precautions Precautions: Fall;Knee Required Braces or Orthoses: Knee Immobilizer - Right Knee Immobilizer - Right: Discontinue once straight leg raise with < 10 degree lag Restrictions Other Position/Activity Restrictions: WBAT      Mobility Bed Mobility         Supine to sit: Min assist     General bed mobility comments: for RLE; HOB raised  Transfers   Equipment used: Rolling walker (2 wheeled)   Sit to Stand: Min guard         General transfer comment: cues for hand placement and RLE position    Balance                                           ADL either performed or assessed with clinical judgement   ADL Overall ADL's : Needs assistance/impaired Eating/Feeding: Independent   Grooming: Supervision/safety;Standing   Upper Body Bathing: Set up;Sitting   Lower Body Bathing: Minimal assistance;Sit to/from stand   Upper Body Dressing : Set up;Sitting   Lower Body Dressing: Minimal assistance;Sit to/from stand   Toilet Transfer: Min guard;RW;Ambulation (chair from high bed)   Toileting- Architect and Hygiene: Min guard;Sit to/from stand         General ADL Comments: pt will stay with mother.  Showed leg lifter vs using belt/sheet for OOB.  Mother can help a little.  He plans to get a shower seat himself:  explained 3:1 could be used, but  had to be moved back and forth and legs wiped after use     Vision         Perception     Praxis      Pertinent Vitals/Pain Pain Score: 3  Pain Location: right knee Pain Descriptors / Indicators: Aching;Sore Pain Intervention(s): Limited activity within patient's tolerance;Monitored during session;Premedicated before session;Repositioned;Ice applied     Hand Dominance     Extremity/Trunk Assessment Upper Extremity Assessment Upper Extremity Assessment: Overall WFL for tasks assessed           Communication Communication Communication: No difficulties   Cognition Arousal/Alertness: Awake/alert Behavior During Therapy: WFL for tasks assessed/performed Overall Cognitive Status: Within Functional Limits for tasks assessed                                     General Comments       Exercises     Shoulder Instructions      Home Living Family/patient expects to be discharged to:: Private residence Living Arrangements: Parent;Other (Comment)                 Bathroom Shower/Tub: Producer, television/film/video: Standard     Home Equipment: Bedside commode  Additional Comments: plans to get a shower seat      Prior Functioning/Environment Level of Independence: Independent                 OT Problem List: Decreased strength;Pain;Decreased knowledge of use of DME or AE      OT Treatment/Interventions: Self-care/ADL training;DME and/or AE instruction;Patient/family education    OT Goals(Current goals can be found in the care plan section) Acute Rehab OT Goals Patient Stated Goal: return to riding motorcycles OT Goal Formulation: With patient Time For Goal Achievement: 05/18/17 Potential to Achieve Goals: Good ADL Goals Pt Will Transfer to Toilet: with supervision;ambulating;bedside commode Pt Will Perform Tub/Shower Transfer: Shower transfer;ambulating;3 in 1;with min guard assist Additional ADL Goal #1: pt will perform bed  mobility at supervision level from a flat bed  OT Frequency: Min 2X/week   Barriers to D/C:            Co-evaluation              AM-PAC PT "6 Clicks" Daily Activity     Outcome Measure Help from another person eating meals?: None Help from another person taking care of personal grooming?: A Little Help from another person toileting, which includes using toliet, bedpan, or urinal?: A Little Help from another person bathing (including washing, rinsing, drying)?: A Little Help from another person to put on and taking off regular upper body clothing?: A Little Help from another person to put on and taking off regular lower body clothing?: A Little 6 Click Score: 19   End of Session    Activity Tolerance: Patient tolerated treatment well Patient left: in chair;with call bell/phone within reach  OT Visit Diagnosis: Pain Pain - Right/Left: Right Pain - part of body: Knee                Time: [id number] OT Time Calculation (min): 24 min Charges:  OT General Charges $OT Visit: 1 Visit OT Evaluation $OT Eval Low Complexity: 1 Low G-Codes:     KingmanMaryellen Ledia [place], OTR/L [phone] 05/14/2017  Charron Coultas 05/14/2017, 9:12 AM

## 2017-05-14 NOTE — Progress Notes (Signed)
Physical Therapy Treatment Patient Details Name: Kevin Carter MRN: [id number] DOB: October 22, 1950 Today's Date: 05/14/2017    History of Present Illness s/p R TKA    PT Comments    The  Patient  Is progressing well. Plans OPPT after Dc.   Follow Up Recommendations  DC plan and follow up therapy as arranged by surgeon;Outpatient PT     Equipment Recommendations  Rolling walker with 5" wheels;3in1 (PT)    Recommendations for Other Services       Precautions / Restrictions Precautions Precautions: Fall;Knee Precaution Comments: did not wear KI Required Braces or Orthoses: Knee Immobilizer - Right Knee Immobilizer - Right: Discontinue once straight leg raise with < 10 degree lag Restrictions Other Position/Activity Restrictions: WBAT    Mobility  Bed Mobility Overal bed mobility: Needs Assistance Bed Mobility: Supine to Sit;Sit to Supine           General bed mobility comments: self assist the  right  with sheet around the foot off and onto bed  Transfers Overall transfer level: Needs assistance Equipment used: Rolling walker (2 wheeled) Transfers: Sit to/from Stand Sit to Stand: Min guard         General transfer comment: cues for hand placement and RLE position  Ambulation/Gait Ambulation/Gait assistance: Min guard Ambulation Distance (Feet): 90 Feet Assistive device: Rolling walker (2 wheeled) Gait Pattern/deviations: Step-to pattern;Step-through pattern;Antalgic         Stairs            Wheelchair Mobility    Modified Rankin (Stroke Patients Only)       Balance                                            Cognition                                              Exercises Total Joint Exercises AStraight Leg Raises: AAROM;Right;10 reps Long Arc Quad: AAROM;Right;10 reps Knee Flexion: AAROM;Right;10 reps Goniometric ROM: 10-45 right knee flexion    General Comments        Pertinent  Vitals/Pain Pain Score: 6  Pain Location: right knee Pain Descriptors / Indicators: Aching;Sore Pain Intervention(s): Monitored during session;Premedicated before session;Repositioned;Ice applied    Home Living                      Prior Function            PT Goals (current goals can now be found in the care plan section) Progress towards PT goals: Progressing toward goals    Frequency    7X/week      PT Plan Current plan remains appropriate    Co-evaluation              AM-PAC PT "6 Clicks" Daily Activity  Outcome Measure  Difficulty turning over in bed (including adjusting bedclothes, sheets and blankets)?: A Little Difficulty moving from lying on back to sitting on the side of the bed? : A Little Difficulty sitting down on and standing up from a chair with arms (e.g., wheelchair, bedside commode, etc,.)?: A Little Help needed moving to and from a bed to chair (including a wheelchair)?: A Little Help needed walking in hospital room?: A Little Help  needed climbing 3-5 steps with a railing? : A Lot 6 Click Score: 17    End of Session Equipment Utilized During Treatment: Right knee immobilizer Activity Tolerance: Patient tolerated treatment well Patient left: in bed;with call bell/phone within reach;with family/visitor present Nurse Communication: Mobility status PT Visit Diagnosis: Difficulty in walking, not elsewhere classified (R26.2)     Time: [id number] PT Time Calculation (min) (ACUTE ONLY): 24 min  Charges:  $Gait Training: 8-22 mins $Therapeutic Exercise: 8-22 mins                    G Codes:          Rada HayHill, Tiandre Teall Elizabeth 05/14/2017, 3:53 PM

## 2017-05-15 LAB — CBC
HEMATOCRIT: 34.2 % — AB (ref 39.0–52.0)
HEMOGLOBIN: 11.5 g/dL — AB (ref 13.0–17.0)
MCH: 30.1 pg (ref 26.0–34.0)
MCHC: 33.6 g/dL (ref 30.0–36.0)
MCV: 89.5 fL (ref 78.0–100.0)
Platelets: 204 10*3/uL (ref 150–400)
RBC: 3.82 MIL/uL — AB (ref 4.22–5.81)
RDW: 13.1 % (ref 11.5–15.5)
WBC: 9.9 10*3/uL (ref 4.0–10.5)

## 2017-05-15 LAB — BASIC METABOLIC PANEL
Anion gap: 7 (ref 5–15)
BUN: 14 mg/dL (ref 6–20)
CHLORIDE: 101 mmol/L (ref 101–111)
CO2: 27 mmol/L (ref 22–32)
Calcium: 8.6 mg/dL — ABNORMAL LOW (ref 8.9–10.3)
Creatinine, Ser: 0.67 mg/dL (ref 0.61–1.24)
GFR calc non Af Amer: >60 mL/min (ref >60)
Glucose, Bld: 110 mg/dL — ABNORMAL HIGH (ref 65–99)
POTASSIUM: 3.8 mmol/L (ref 3.5–5.1)
SODIUM: 135 mmol/L (ref 135–145)

## 2017-05-15 NOTE — Progress Notes (Signed)
Occupational Therapy Treatment Patient Details Name: Kevin Carter MRN: [id number] DOB: [birth year] Today's Date: 05/15/2017    History of present illness s/p R TKA   OT comments  All education completed this session.  Follow Up Recommendations  No OT follow up;Supervision/Assistance - 24 hour    Equipment Recommendations  None recommended by OT    Recommendations for Other Services      Precautions / Restrictions Precautions Precautions: Fall;Knee Precaution Comments: wore KI for shower Required Braces or Orthoses: Knee Immobilizer - Right Knee Immobilizer - Right: Discontinue once straight leg raise with < 10 degree lag Restrictions Other Position/Activity Restrictions: WBAT       Mobility Bed Mobility               General bed mobility comments: oob by PT  Transfers       Sit to Stand: Supervision              Balance                                           ADL either performed or assessed with clinical judgement   ADL                                   Tub/ Shower Transfer: Walk-in shower;Min guard;Ambulation;Rolling walker     General ADL Comments: pt used leg lifter to assist with KI application.  Practiced shower transfer and gave him a handout. Reviewed long netted sponge on a stick from health section of store would be helpful     Vision       Perception     Praxis      Cognition Arousal/Alertness: Awake/alert Behavior During Therapy: WFL for tasks assessed/performed Overall Cognitive Status: Within Functional Limits for tasks assessed                                          Exercises     Shoulder Instructions       General Comments      Pertinent Vitals/ Pain       Pain Score: 5  Pain Location: right knee Pain Descriptors / Indicators: Aching;Sore Pain Intervention(s): Limited activity within patient's tolerance;Monitored during session;Premedicated before  session;Repositioned;Ice applied  Home Living                                          Prior Functioning/Environment              Frequency           Progress Toward Goals  OT Goals(current goals can now be found in the care plan section)  Progress towards OT goals: Progressing toward goals (no further OT needs)     Plan      Co-evaluation                 AM-PAC PT "6 Clicks" Daily Activity     Outcome Measure   Help from another person eating meals?: None Help from another person taking care of personal grooming?: A Little Help from another person toileting, which includes using  toliet, bedpan, or urinal?: A Little Help from another person bathing (including washing, rinsing, drying)?: A Little Help from another person to put on and taking off regular upper body clothing?: A Little Help from another person to put on and taking off regular lower body clothing?: A Little 6 Click Score: 19    End of Session    OT Visit Diagnosis: Pain Pain - Right/Left: Right Pain - part of body: Knee   Activity Tolerance Patient tolerated treatment well   Patient Left in chair;with call bell/phone within reach   Nurse Communication          Time: [id number] OT Time Calculation (min): 16 min  Charges: OT General Charges $OT Visit: 1 Visit OT Treatments $Self Care/Home Management : 8-22 mins  Marica OtterMaryellen Aadya Kindler, OTR/L [phone] 05/15/2017   Kevin Carter 05/15/2017, 10:50 AM

## 2017-05-15 NOTE — Progress Notes (Signed)
Physical Therapy Treatment Patient Details Name: Kevin KollerWilliam Carter MRN: [id number] DOB: 03/13/1951 Today's Date: 05/15/2017    History of Present Illness s/p R TKA    PT Comments    The patient is progressing well. Stairs next visit.   Follow Up Recommendations  DC plan and follow up therapy as arranged by surgeon;Outpatient PT     Equipment Recommendations  Rolling walker with 5" wheels;3in1 (PT)    Recommendations for Other Services       Precautions / Restrictions Precautions Precautions: Fall;Knee Precaution Comments: wore KI for shower Required Braces or Orthoses: Knee Immobilizer - Right Knee Immobilizer - Right: Discontinue once straight leg raise with < 10 degree lag Restrictions Other Position/Activity Restrictions: WBAT    Mobility  Bed Mobility   Bed Mobility: Supine to Sit     Supine to sit: Supervision     General bed mobility comments: used leg lifter  Transfers Overall transfer level: Needs assistance Equipment used: Rolling walker (2 wheeled) Transfers: Sit to/from Stand Sit to Stand: Supervision         General transfer comment: cues for hand placement and RLE position  Ambulation/Gait Ambulation/Gait assistance: Min guard Ambulation Distance (Feet): 90 Feet   Gait Pattern/deviations: Step-to pattern;Step-through pattern     General Gait Details: cues for sequence   Stairs            Wheelchair Mobility    Modified Rankin (Stroke Patients Only)       Balance                                            Cognition Arousal/Alertness: Awake/alert Behavior During Therapy: WFL for tasks assessed/performed Overall Cognitive Status: Within Functional Limits for tasks assessed                                        Exercises Total Joint Exercises Ankle Circles/Pumps: Both;10 reps Quad Sets: Both;10 reps Towel Squeeze: AROM;Both;10 reps Short Arc QuadBarbaraann Carter: AAROM;Right;10 reps Heel Slides:  AAROM;Right;10 reps Hip ABduction/ADduction: Right;10 reps;AAROM Straight Leg Raises: AAROM;Right;10 reps Long Arc Quad: AAROM;Right;10 reps Knee Flexion: AAROM;Right;10 reps Goniometric ROM: 10-80 right knee    General Comments        Pertinent Vitals/Pain Pain Score: 6  Pain Location: right knee Pain Descriptors / Indicators: Aching;Sore Pain Intervention(s): Monitored during session;Premedicated before session;Repositioned;Ice applied    Home Living                      Prior Function            PT Goals (current goals can now be found in the care plan section) Progress towards PT goals: Progressing toward goals    Frequency    7X/week      PT Plan Current plan remains appropriate    Co-evaluation              AM-PAC PT "6 Clicks" Daily Activity  Outcome Measure  Difficulty turning over in bed (including adjusting bedclothes, sheets and blankets)?: A Little Difficulty moving from lying on back to sitting on the side of the bed? : A Little Difficulty sitting down on and standing up from a chair with arms (e.g., wheelchair, bedside commode, etc,.)?: A Little Help needed moving to and from a  bed to chair (including a wheelchair)?: A Little Help needed walking in hospital room?: A Little Help needed climbing 3-5 steps with a railing? : A Lot 6 Click Score: 17    End of Session   Activity Tolerance: Patient tolerated treatment well Patient left: in chair;with call bell/phone within reach   PT Visit Diagnosis: Difficulty in walking, not elsewhere classified (R26.2)     Time: 1610-9604 PT Time Calculation (min) (ACUTE ONLY): 47 min  Charges:  $Gait Training: 8-22 mins $Therapeutic Exercise: 8-22 mins $Self Care/Home Management: 8-22                    G Codes:       {e  Rada Hay 05/15/2017, 12:56 PM

## 2017-05-15 NOTE — Progress Notes (Signed)
Physical Therapy Treatment Patient Details Name: Kevin KollerWilliam Carter MRN: [id number] DOB: 08/14/1950 Today's Date: 05/15/2017    History of Present Illness s/p R TKA    PT Comments     Ready for DC.   Follow Up Recommendations  DC plan and follow up therapy as arranged by surgeon;Outpatient PT     Equipment Recommendations  Rolling walker with 5" wheels;3in1 (PT)    Recommendations for Other Services       Precautions / Restrictions Precautions Precautions: Fall;Knee Precaution Comments: wore KI for shower Required Braces or Orthoses: Knee Immobilizer - Right Knee Immobilizer - Right: Discontinue once straight leg raise with < 10 degree lag Restrictions Other Position/Activity Restrictions: WBAT    Mobility  Bed Mobility   Bed Mobility: Supine to Sit     Supine to sit: Supervision     General bed mobility comments: in recliner  Transfers Overall transfer level: Needs assistance Equipment used: Rolling walker (2 wheeled) Transfers: Sit to/from Stand Sit to Stand: Supervision         General transfer comment: cues for hand placement and RLE position  Ambulation/Gait Ambulation/Gait assistance: Supervision Ambulation Distance (Feet): 50 Feet Assistive device: Rolling walker (2 wheeled) Gait Pattern/deviations: Step-to pattern;Step-through pattern     General Gait Details: cues for sequence   Stairs Stairs: Yes   Stair Management: One rail Right;Step to pattern;Forwards;With cane Number of Stairs: 2 General stair comments: cues for sequence  Wheelchair Mobility    Modified Rankin (Stroke Patients Only)       Balance                                            Cognition Arousal/Alertness: Awake/alert Behavior During Therapy: WFL for tasks assessed/performed Overall Cognitive Status: Within Functional Limits for tasks assessed                                        Exercises Total Joint Exercises Ankle  Circles/Pumps: Both;10 reps Quad Sets: Both;10 reps Towel Squeeze: AROM;Both;10 reps Short Arc QuadBarbaraann Carter: AAROM;Right;10 reps Heel Slides: AAROM;Right;10 reps Hip ABduction/ADduction: Right;10 reps;AAROM Straight Leg Raises: AAROM;Right;10 reps Long Arc Quad: AAROM;Right;10 reps Knee Flexion: AAROM;Right;10 reps Goniometric ROM: 10-80 right knee    General Comments        Pertinent Vitals/Pain Pain Score: 3  Pain Location: right knee Pain Descriptors / Indicators: Aching;Sore Pain Intervention(s): Monitored during session;Premedicated before session    Home Living                      Prior Function            PT Goals (current goals can now be found in the care plan section) Progress towards PT goals: Progressing toward goals    Frequency    7X/week      PT Plan Current plan remains appropriate    Co-evaluation              AM-PAC PT "6 Clicks" Daily Activity  Outcome Measure  Difficulty turning over in bed (including adjusting bedclothes, sheets and blankets)?: None Difficulty moving from lying on back to sitting on the side of the bed? : None Difficulty sitting down on and standing up from a chair with arms (e.g., wheelchair, bedside commode, etc,.)?: A Little  Help needed moving to and from a bed to chair (including a wheelchair)?: A Little Help needed walking in hospital room?: A Little Help needed climbing 3-5 steps with a railing? : A Little 6 Click Score: 20    End of Session Equipment Utilized During Treatment: Right knee immobilizer Activity Tolerance: Patient tolerated treatment well Patient left: in chair;with call bell/phone within reach Nurse Communication: Mobility status PT Visit Diagnosis: Difficulty in walking, not elsewhere classified (R26.2)     Time: 1610-9604 PT Time Calculation (min) (ACUTE ONLY): 25 min  Charges:  $Gait Training: 8-22 mins $Therapeutic Exercise: 8-22 mins $Self Care/Home Management: 8-22                     G Codes:          Rada Hay 05/15/2017, 1:00 PM

## 2018-05-16 ENCOUNTER — Encounter: Payer: Self-pay | Admitting: Emergency Medicine

## 2018-05-16 ENCOUNTER — Ambulatory Visit: Payer: Self-pay | Admitting: Emergency Medicine

## 2018-05-16 VITALS — BP 127/62 | HR 74 | Temp 97.6°F | Resp 18 | Ht 73.0 in | Wt 220.6 lb

## 2018-05-16 DIAGNOSIS — Z024 Encounter for examination for driving license: Secondary | ICD-10-CM

## 2018-05-16 NOTE — Progress Notes (Signed)
This patient presents for DOT examination for fitness for duty.   Medical History:  1. Head/Brain Injuries, disorders or illnesses no  2. Seizures, epilepsy no  3. Eye disorders or impaired vision (except corrective lenses) no  4. Ear disorders, loss of hearing or balance no  5. Heart disease or heart attack, other cardiovascular condition no  6. Heart surgery (valve replacement/bypass, angioplasty, pacemaker/defribrillator) no  7. High blood pressure no  8. High cholesterol no  9. Chronic cough, shortness of breath or other breathing problems no  10. Lung disease (emphysema, asthma or chronic bronchitis) no  11. Kidney disease, dialysis no  12. Digestive problems  no  13. Diabetes or elevated blood sugar no                      If yes to #13, Insulin use n/a  14. Nervious or psychiatric disorders, e.g., severe depression no  15. Fainting or syncope no  16. Dizziness, headaches, numbness, tingling or memory loss no  17. Unexplained weight loss no  18. Stroke, TIA or paralysis no  19. Missing or impaired hand, arm, foot, leg, finger, toe no  20. Spinal injury or disease no  21. Bone, muscles or nerve problems no  22. Blood clots or bleeding bleeding disorders no  23. Cancer no  24. Chronic infection or other chronic diseases no  25. Sleep disorders, pauses in breathing while asleep, daytime sleepiness, loud snoring no  26. Have you ever had a sleep test? no  27.  Have you ever spent a night in the hospital? yes  28. Have you ever had a broken bone? yes  29. Have you or or do you use tobacco products? no  30. Regular, frequent alcohol use no  31. Illegal substance use within the past 2 years no  32.  Have you ever failed a drug test or been dependent on an illegal substance? no   Current Medications: Prior to Admission medications   Medication Sig Start Date End Date Taking? Authorizing Provider  Multiple Vitamins-Minerals (MULTIVITAMIN ADULT PO) Multivitamin 50 Plus    Yes [provider]  Omega-3 Fatty Acids (FISH OIL) 1000 MG CAPS Fish Oil   Yes [provider]    Medical Examiner's Comments on Health History:  In good general medical condition.  TESTING:   Visual Acuity Screening   Right eye Left eye Both eyes  Without correction:     With correction: 20/15-2 20/15-1 20/15  Comments: Peripheral Vision: Right eye 85 degrees. Left eye 85 degrees.  The patient can distinguish the colors red, amber and green.  Hearing Screening Comments: The patient was able to hear a forced whisper from 10 feet.  Monocular Vision: No.  Hearing Aid used for test: No. Hearing Aid required to to meet standard: No.  BP 127/62   Pulse 74   Temp 97.6 F (36.4 C) (Oral)   Resp 18   Ht 6\' 1"  (1.854 m)   Wt 220 lb 9.6 oz (100.1 kg)   SpO2 95%   BMI 29.10 kg/m  Pulse rate is regular  Comments: Urine Specimen:  SpGr. 1.025   Protein:  Negative   Blood:  Negative   Sugar:  Negative  PHYSICAL EXAMINATION:  General Appearance Not markedly obese. No tremor, signs of alcoholism, problem drinking or drug abuse.   Skin Warm, dry and intact.   Eyes Pupils are equal, round and reactive to light and accommodation, extraocular movements are  intact. No exophthalmos, no nystagmus.   Ears Normal external ears. External canal without occlusion. No scarring of the TM. No perforation of the TM.  Mouth and Throat Clear and moist. No irremedial deformities likely to interfere with breathing or swallowing.  Heart No murmurs, extra sounds, evidence of cardiomegaly. No pacemaker. No implantable defibrillator.  Lungs and Chest (excluding breasts) Normal chest expansion, respiratory rate, breath sounds. No cyanosis.  Abdomen and Viscera No liver enlargement. No splenic enlargement. No masses, bruits, hernias or significant abdominal wall weakness.  Genitourinary  No inguinal or femoral hernia.  Spine and other musculoskeletal No tenderness, no limitation of  motion, no deformities. No evidence of previous surgery.  Extremities No loss or impairment of leg, foot, toe, arm, hand, finger. No perceptible limp, deformities, atrophy, weakness, paralysis, clubbing, edema, hypotonia. Patient has sufficient grasp and prehension to maintain steering wheel grip. Patient has sufficient mobility and strength in the lower limbs to operate pedals properly.  Neurologic Normal equilibrium, coordination, speech pattern. No paresthesia, asymmetry of deep tendon reflexes, sensory or positional abnormalities. No abnormality of patellar or Babinski's reflexes.  Gait Not antalgic or ataxic  Vascular Normal pulses. No carotid or arterial bruits. No varicose veins.     Certification Status: does meet standards for 2 year certificate.    Certification expires 05/17/2019.

## 2018-05-16 NOTE — Patient Instructions (Signed)
° ° ° °  If you have lab work done today you will be contacted with your lab results within the next 2 weeks.  If you have not heard from us then please contact us. The fastest way to get your results is to register for My Chart. ° ° °IF you received an x-ray today, you will receive an invoice from [facility]. Please contact [facility] at [phone] with questions or concerns regarding your invoice.  ° °IF you received labwork today, you will receive an invoice from [facility]. Please contact [facility] at [phone] with questions or concerns regarding your invoice.  ° °Our billing staff will not be able to assist you with questions regarding bills from these companies. ° °You will be contacted with the lab results as soon as they are available. The fastest way to get your results is to activate your My Chart account. Instructions are located on the last page of this paperwork. If you have not heard from us regarding the results in 2 weeks, please contact this office. °  ° ° ° °

## 2018-06-10 ENCOUNTER — Other Ambulatory Visit: Payer: Self-pay | Admitting: Family Medicine

## 2018-06-10 DIAGNOSIS — Z87891 Personal history of nicotine dependence: Secondary | ICD-10-CM

## 2018-06-13 ENCOUNTER — Ambulatory Visit
Admission: RE | Admit: 2018-06-13 | Discharge: 2018-06-13 | Disposition: A | Discharge status: Home / Self Care | Payer: Medicare HMO | Source: Ambulatory Visit | Attending: Family Medicine | Admitting: Family Medicine

## 2018-06-13 DIAGNOSIS — Z87891 Personal history of nicotine dependence: Secondary | ICD-10-CM

## 2018-08-14 ENCOUNTER — Encounter (HOSPITAL_COMMUNITY): Payer: Self-pay

## 2018-08-14 NOTE — Patient Instructions (Addendum)
Kevin OdeaWilliam Keith Carter  [birth year]    Your procedure is scheduled on:  08-27-2018   [facility],  Report to admitting at  6:00 AM    Call this number if you have problems the morning of surgery [phone]       Remember: Do not eat food or drink liquids :After Midnight.                                       BRUSH YOUR TEETH MORNING OF SURGERY AND RINSE YOUR MOUTH OUT, NO CHEWING GUM CANDY OR MINTS.        Take these medicines the morning of surgery with A SIP OF WATER:   NONE                                  You may not have any metal on your body including hair pins and               piercings  Do not wear jewelry, make-up, lotions, powders or perfumes, deodorant                    Men may shave face and neck.      Do not bring valuables to the hospital. [facility] IS NOT             RESPONSIBLE   FOR VALUABLES.  Contacts, dentures or bridgework may not be worn into surgery.  Leave suitcase in the car. After surgery it may be brought to your room.      _____________________________________________________________________             [facility] Pre[facility] Before surgery, you can play an important role.  Because skin is not sterile, your skin needs to be as free of germs as possible.  You can reduce the number of germs on your skin by washing with CHG (chlorahexidine gluconate) soap before surgery.  CHG is an antiseptic cleaner which kills germs and bonds with the skin to continue killing germs even after washing. Please DO NOT use if you have an allergy to CHG or antibacterial soaps.  If your skin becomes reddened/irritated stop using the CHG and inform your nurse when you arrive at Short Stay. Do not shave (including legs and underarms) for at least 48 hours prior to the first CHG shower.  You may shave your face/neck. Please follow these instructions carefully:  1.  Shower with CHG Soap the night  before surgery and the  morning of Surgery.  2.  If you choose to wash your hair, wash your hair first as usual with your  normal  shampoo.  3.  After you shampoo, rinse your hair and body thoroughly to remove the  shampoo.                            4.  Use CHG as you would any other liquid soap.  You can apply chg directly  to the skin and wash                       Gently with a scrungie or clean washcloth.  5.  Apply the CHG Soap to your body ONLY FROM THE NECK DOWN.   Do not use on face/ open                           Wound or open sores. Avoid contact with eyes, ears mouth and genitals (private parts).                       Wash face,  Genitals (private parts) with your normal soap.             6.  Wash thoroughly, paying special attention to the area where your surgery  will be performed.  7.  Thoroughly rinse your body with warm water from the neck down.  8.  DO NOT shower/wash with your normal soap after using and rinsing off  the CHG Soap.             9.  Pat yourself dry with a clean towel.            10.  Wear clean pajamas.            11.  Place clean sheets on your bed the night of your first shower and do not  sleep with pets. Day of Surgery : Do not apply any lotions/deodorants the morning of surgery.  Please wear clean clothes to the hospital/surgery center.  FAILURE TO FOLLOW THESE INSTRUCTIONS MAY RESULT IN THE CANCELLATION OF YOUR SURGERY PATIENT SIGNATURE_________________________________  NURSE SIGNATURE__________________________________  ________________________________________________________________________   Kevin Carter  An incentive spirometer is a tool that can help keep your lungs clear and active. This tool measures how well you are filling your lungs with each breath. Taking long deep breaths may help reverse or decrease the chance of developing breathing (pulmonary) problems (especially infection) following:  A long period of time when you are  unable to move or be active. BEFORE THE PROCEDURE   If the spirometer includes an indicator to show your best effort, your nurse or respiratory therapist will set it to a desired goal.  If possible, sit up straight or lean slightly forward. Try not to slouch.  Hold the incentive spirometer in an upright position. INSTRUCTIONS FOR USE  1. Sit on the edge of your bed if possible, or sit up as far as you can in bed or on a chair. 2. Hold the incentive spirometer in an upright position. 3. Breathe out normally. 4. Place the mouthpiece in your mouth and seal your lips tightly around it. 5. Breathe in slowly and as deeply as possible, raising the piston or the ball toward the top of the column. 6. Hold your breath for 3-5 seconds or for as long as possible. Allow the piston or ball to fall to the bottom of the column. 7. Remove the mouthpiece from your mouth and breathe out normally. 8. Rest for a few seconds and repeat Steps 1 through 7 at least 10 times every 1-2 hours when you are awake. Take your time and take a few normal breaths between deep breaths. 9. The spirometer may include an indicator to show your best effort. Use the indicator as a goal to work toward during each repetition. 10. After each set of 10 deep breaths, practice coughing to be sure your lungs are clear. If you have an incision (the cut made at the time of surgery), support your incision when coughing by placing a pillow or rolled  up towels firmly against it. Once you are able to get out of bed, walk around indoors and cough well. You may stop using the incentive spirometer when instructed by your caregiver.  RISKS AND COMPLICATIONS  Take your time so you do not get dizzy or light-headed.  If you are in pain, you may need to take or ask for pain medication before doing incentive spirometry. It is harder to take a deep breath if you are having pain. AFTER USE  Rest and breathe slowly and easily.  It can be helpful to  keep track of a log of your progress. Your caregiver can provide you with a simple table to help with this. If you are using the spirometer at home, follow these instructions: SEEK MEDICAL CARE IF:   You are having difficultly using the spirometer.  You have trouble using the spirometer as often as instructed.  Your pain medication is not giving enough relief while using the spirometer.  You develop fever of 100.5 F (38.1 C) or higher. SEEK IMMEDIATE MEDICAL CARE IF:   You cough up bloody sputum that had not been present before.  You develop fever of 102 F (38.9 C) or greater.  You develop worsening pain at or near the incision site. MAKE SURE YOU:   Understand these instructions.  Will watch your condition.  Will get help right away if you are not doing well or get worse. Document Released: 11/19/2006 Document Revised: 10/01/2011 Document Reviewed: 01/20/2007 ExitCare Patient Information 2014 ExitCare, [place].   ________________________________________________________________________  WHAT IS A BLOOD TRANSFUSION? Blood Transfusion Information  A transfusion is the replacement of blood or some of its parts. Blood is made up of multiple cells which provide different functions.  Red blood cells carry oxygen and are used for blood loss replacement.  White blood cells fight against infection.  Platelets control bleeding.  Plasma helps clot blood.  Other blood products are available for specialized needs, such as hemophilia or other clotting disorders. BEFORE THE TRANSFUSION  Who gives blood for transfusions?   Healthy volunteers who are fully evaluated to make sure their blood is safe. This is blood bank blood. Transfusion therapy is the safest it has ever been in the practice of medicine. Before blood is taken from a donor, a complete history is taken to make sure that person has no history of diseases nor engages in risky social behavior (examples are intravenous drug  use or sexual activity with multiple partners). The donor's travel history is screened to minimize risk of transmitting infections, such as malaria. The donated blood is tested for signs of infectious diseases, such as HIV and hepatitis. The blood is then tested to be sure it is compatible with you in order to minimize the chance of a transfusion reaction. If you or a relative donates blood, this is often done in anticipation of surgery and is not appropriate for emergency situations. It takes many days to process the donated blood. RISKS AND COMPLICATIONS Although transfusion therapy is very safe and saves many lives, the main dangers of transfusion include:   Getting an infectious disease.  Developing a transfusion reaction. This is an allergic reaction to something in the blood you were given. Every precaution is taken to prevent this. The decision to have a blood transfusion has been considered carefully by your caregiver before blood is given. Blood is not given unless the benefits outweigh the risks. AFTER THE TRANSFUSION  Right after receiving a blood transfusion, you will usually feel  much better and more energetic. This is especially true if your red blood cells have gotten low (anemic). The transfusion raises the level of the red blood cells which carry oxygen, and this usually causes an energy increase.  The nurse administering the transfusion will monitor you carefully for complications. HOME CARE INSTRUCTIONS  No special instructions are needed after a transfusion. You may find your energy is better. Speak with your caregiver about any limitations on activity for underlying diseases you may have. SEEK MEDICAL CARE IF:   Your condition is not improving after your transfusion.  You develop redness or irritation at the intravenous (IV) site. SEEK IMMEDIATE MEDICAL CARE IF:  Any of the following symptoms occur over the next 12 hours:  Shaking chills.  You have a temperature by mouth  above 102 F (38.9 C), not controlled by medicine.  Chest, back, or muscle pain.  People around you feel you are not acting correctly or are confused.  Shortness of breath or difficulty breathing.  Dizziness and fainting.  You get a rash or develop hives.  You have a decrease in urine output.  Your urine turns a dark color or changes to pink, red, or brown. Any of the following symptoms occur over the next 10 days:  You have a temperature by mouth above 102 F (38.9 C), not controlled by medicine.  Shortness of breath.  Weakness after normal activity.  The white part of the eye turns yellow (jaundice).  You have a decrease in the amount of urine or are urinating less often.  Your urine turns a dark color or changes to pink, red, or brown. Document Released: 07/06/2000 Document Revised: 10/01/2011 Document Reviewed: 02/23/2008 [facility] Patient Information 2014 [place], [place].  _______________________________________________________________________

## 2018-08-18 ENCOUNTER — Encounter (HOSPITAL_COMMUNITY)
Admission: RE | Admit: 2018-08-18 | Discharge: 2018-08-18 | Disposition: A | Discharge status: Home / Self Care | Payer: Medicare HMO | Source: Ambulatory Visit | Attending: Orthopedic Surgery | Admitting: Orthopedic Surgery

## 2018-08-18 ENCOUNTER — Encounter (HOSPITAL_COMMUNITY): Payer: Self-pay

## 2018-08-18 ENCOUNTER — Other Ambulatory Visit: Payer: Self-pay

## 2018-08-18 DIAGNOSIS — M25361 Other instability, right knee: Secondary | ICD-10-CM | POA: Insufficient documentation

## 2018-08-18 DIAGNOSIS — Z01818 Encounter for other preprocedural examination: Secondary | ICD-10-CM | POA: Diagnosis present

## 2018-08-18 HISTORY — DX: Abdominal aortic ectasia: I77.811

## 2018-08-18 HISTORY — DX: Nocturia: R35.1

## 2018-08-18 HISTORY — DX: Gastro-esophageal reflux disease without esophagitis: K21.9

## 2018-08-18 HISTORY — DX: Unspecified osteoarthritis, unspecified site: M19.90

## 2018-08-18 HISTORY — DX: Carpal tunnel syndrome, right upper limb: G56.01

## 2018-08-18 HISTORY — DX: Presence of spectacles and contact lenses: Z97.3

## 2018-08-18 LAB — URINALYSIS, ROUTINE W REFLEX MICROSCOPIC
Bilirubin Urine: NEGATIVE
Glucose, UA: NEGATIVE mg/dL
Hgb urine dipstick: NEGATIVE
Ketones, ur: NEGATIVE mg/dL
Leukocytes, UA: NEGATIVE
Nitrite: NEGATIVE
Protein, ur: NEGATIVE mg/dL
Specific Gravity, Urine: 1.02 (ref 1.005–1.030)
pH: 6 (ref 5.0–8.0)

## 2018-08-18 LAB — CBC WITH DIFFERENTIAL/PLATELET
Abs Immature Granulocytes: 0.02 10*3/uL (ref 0.00–0.07)
Basophils Absolute: 0.1 10*3/uL (ref 0.0–0.1)
Basophils Relative: 1 %
Eosinophils Absolute: 0.2 10*3/uL (ref 0.0–0.5)
Eosinophils Relative: 3 %
HCT: 44.6 % (ref 39.0–52.0)
Hemoglobin: 14.8 g/dL (ref 13.0–17.0)
Immature Granulocytes: 0 %
Lymphocytes Relative: 24 %
Lymphs Abs: 1.5 10*3/uL (ref 0.7–4.0)
MCH: 29.8 pg (ref 26.0–34.0)
MCHC: 33.2 g/dL (ref 30.0–36.0)
MCV: 89.9 fL (ref 80.0–100.0)
Monocytes Absolute: 0.7 10*3/uL (ref 0.1–1.0)
Monocytes Relative: 11 %
Neutro Abs: 3.9 10*3/uL (ref 1.7–7.7)
Neutrophils Relative %: 61 %
Platelets: 242 10*3/uL (ref 150–400)
RBC: 4.96 MIL/uL (ref 4.22–5.81)
RDW: 12.5 % (ref 11.5–15.5)
WBC: 6.3 10*3/uL (ref 4.0–10.5)
nRBC: 0 % (ref 0.0–0.2)

## 2018-08-18 LAB — PROTIME-INR
INR: 1.02
Prothrombin Time: 13.3 seconds (ref 11.4–15.2)

## 2018-08-18 LAB — COMPREHENSIVE METABOLIC PANEL
ALT: 26 U/L (ref 0–44)
AST: 21 U/L (ref 15–41)
Albumin: 4.5 g/dL (ref 3.5–5.0)
Alkaline Phosphatase: 53 U/L (ref 38–126)
Anion gap: 7 (ref 5–15)
BUN: 23 mg/dL (ref 8–23)
CO2: 25 mmol/L (ref 22–32)
Calcium: 9.2 mg/dL (ref 8.9–10.3)
Chloride: 104 mmol/L (ref 98–111)
Creatinine, Ser: 0.66 mg/dL (ref 0.61–1.24)
GFR calc Af Amer: >60 mL/min (ref >60)
GFR calc non Af Amer: >60 mL/min (ref >60)
Glucose, Bld: 90 mg/dL (ref 70–99)
Potassium: 4.2 mmol/L (ref 3.5–5.1)
Sodium: 136 mmol/L (ref 135–145)
Total Bilirubin: 0.8 mg/dL (ref 0.3–1.2)
Total Protein: 7.4 g/dL (ref 6.5–8.1)

## 2018-08-18 LAB — SURGICAL PCR SCREEN
MRSA, PCR: NEGATIVE
Staphylococcus aureus: NEGATIVE

## 2018-08-18 LAB — APTT: aPTT: 30 seconds (ref 24–36)

## 2018-08-18 NOTE — Progress Notes (Addendum)
A1c result, 5.7, dated 07-30-2018 in epic.  Pt pcp surgical clearance dated 07-30-2018 and office note in chart, dr Brett Fairy.  ADDENDUM:  Final EKG dated 08-18-2018 in epic.

## 2018-08-18 NOTE — H&P (Signed)
TOTAL KNEE POLYETHYLENE REVISION ADMISSION H&P  Patient is being admitted for right knee polyethylene revision  Subjective:  Chief Complaint: Unstable right total knee arthroplasty  HPI: Kevin Carter is a 68 year old male who presents for pre-operative visit in preparation for their right knee polyethylene revision, which is scheduled on 08-27-2018 with Dr. Lequita Halt at [facility]. The patient has had symptoms in the right knee including pain, instability and swelling which has impacted their quality of life and ability to do activities of daily living. The patient currently has a diagnosis of right knee instability and has failed conservative treatments including activity modification. The patient has had previous arthroplasty on the right knee performed in 2018. The patient denies an active infection.  Patient Active Problem List   Diagnosis Date Noted  . OA (osteoarthritis) of knee 05/13/2017   Past Medical History:  Diagnosis Date  . Carpal tunnel syndrome, right   . Ectatic abdominal aorta (HCC)    per abd. aorta ultrasound in epic dated 06-13-2018. 2.8cm  . GERD (gastroesophageal reflux disease)    no meds  . Hypertriglyceridemia   . Nocturia   . OA (osteoarthritis)    wrist, knees  . Pre-diabetes    followed by pcp  . Wears glasses     Past Surgical History:  Procedure Laterality Date  . COLONOSCOPY  2017  . NASAL SEPTUM SURGERY  1992  . TOTAL KNEE ARTHROPLASTY Right 05/13/2017   Procedure: RIGHT TOTAL KNEE ARTHROPLASTY;  Surgeon: Ollen Gross, MD;  Location: [facility];  Service: Orthopedics;  Laterality: Right;    No current facility-administered medications for this encounter.    Current Outpatient Medications  Medication Sig Dispense Refill Last Dose  . Multiple Vitamins-Minerals (MULTIVITAMIN ADULT PO) Take 1 tablet by mouth daily. ONE-A-DAY   Taking  . Omega-3 Fatty Acids (FISH OIL) 1000 MG CAPS Take 1 capsule by mouth daily.    Taking  .  rosuvastatin (CRESTOR) 10 MG tablet Take 10 mg by mouth every evening.      Marland Kitchen aspirin EC 81 MG tablet Take 81 mg by mouth daily.      No Known Allergies  Social History   Tobacco Use  . Smoking status: Former Smoker    Years: 5.00    Types: Cigarettes    Last attempt to quit: 07/23/1978    Years since quitting: 40.0  . Smokeless tobacco: Never Used  . Tobacco comment: SOCIAL SMOKER  Substance Use Topics  . Alcohol use: Yes    Alcohol/week: 14.0 standard drinks    Types: 14 Standard drinks or equivalent per week    Comment: 2 PER DAY    Family History  Problem Relation Age of Onset  . Hypertension Mother   . Stomach cancer Father        passed away from stomach cancer 2001  . Diabetes Father        only diagnosed in the 36s     Review of Systems  Constitutional: Negative for chills and fever.  HENT: Negative for congestion, sore throat and tinnitus.   Eyes: Negative for double vision, photophobia and pain.  Respiratory: Negative for cough, shortness of breath and wheezing.   Cardiovascular: Negative for chest pain, palpitations and orthopnea.  Gastrointestinal: Negative for heartburn, nausea and vomiting.  Genitourinary: Negative for dysuria, frequency and urgency.  Musculoskeletal: Positive for joint pain.  Neurological: Negative for dizziness, weakness and headaches.    Objective:  Physical Exam  Well nourished and well  developed.  General: Alert and oriented x3, cooperative and pleasant, no acute distress.  Head: normocephalic, atraumatic, neck supple.  Eyes: EOMI.  Respiratory: breath sounds clear in all fields, no wheezing, rales, or rhonchi. Cardiovascular: Regular rate and rhythm, no murmurs, gallops or rubs.  Abdomen: non-tender to palpation and soft, normoactive bowel sounds. Musculoskeletal:  Right knee exam: Range of motion: 2 degrees of hyper-extension to 130 degrees of flexion. Fair amount of varus-valgus play as well as AP laxity. No gross  instability.  Calves soft and nontender. Motor function intact in LE. Strength 5/5 LE bilaterally. Neuro: Distal pulses 2+. Sensation to light touch intact in LE.  Vital signs in last 24 hours: Temp:  [98.7 F (37.1 C)] 98.7 F (37.1 C) (01/27 1122) Pulse Rate:  [67] 67 (01/27 1122) Resp:  [12] 12 (01/27 1122) BP: (127)/(53) 127/53 (01/27 1122) SpO2:  [99 %] 99 % (01/27 1122) Weight:  [95.9 kg] 95.9 kg (01/27 1122)  Labs:   Estimated body mass index is 27.15 kg/m as calculated from the following:   Height as of 08/18/18: 6\' 2"  (1.88 m).   Weight as of 08/18/18: 95.9 kg.   Imaging Review Plain radiographs demonstrate prosthesis in excellent position with no periprosthetic abnormalities.    Preoperative templating of the joint replacement has been completed, documented, and submitted to the Operating Room personnel in order to optimize intra-operative equipment management.    Assessment/Plan:  Unstable right total knee arthroplasty  The patient history, physical examination, clinical judgment of the provider and imaging studies are consistent with unstable right TKA and right knee polyethylene revision is deemed medically necessary. The treatment options including medical management, injection therapy arthroscopy and arthroplasty were discussed at length. The risks and benefits of total knee arthroplasty were presented and reviewed. The risks due to aseptic loosening, infection, stiffness, patella tracking problems, thromboembolic complications and other imponderables were discussed. The patient acknowledged the explanation, agreed to proceed with the plan and consent was signed. Patient is being admitted for inpatient treatment for surgery, pain control, PT, OT, prophylactic antibiotics, VTE prophylaxis, progressive ambulation and ADL's and discharge planning. The patient is planning to be discharged home.   Therapy Plans: HEP versus outpatient therapy Disposition: Home with  mother Planned DVT Prophylaxis: Aspirin 325 mg BID DME needed: None PCP: Brett FairySamantha Eksir, MD TXA: IV Allergies: NKDA Anesthesia Concerns: None BMI: 27  - Patient was instructed on what medications to stop prior to surgery. - Follow-up visit in 2 weeks with Dr. Lequita HaltAluisio - Begin physical therapy following surgery - Pre-operative lab work as pre-surgical testing - Prescriptions will be provided in hospital at time of discharge  Arther AbbottKristie Shavon Zenz, PA-C Orthopedic Surgery [facility] [place]

## 2018-08-26 ENCOUNTER — Encounter (HOSPITAL_COMMUNITY): Payer: Self-pay | Admitting: Anesthesiology

## 2018-08-26 MED ORDER — BUPIVACAINE LIPOSOME 1.3 % IJ SUSP
20.0000 mL | Freq: Once | INTRAMUSCULAR | Status: DC
  Filled 2018-08-26: qty 20

## 2018-08-26 NOTE — Anesthesia Preprocedure Evaluation (Addendum)
Anesthesia Evaluation  Patient identified by MRN, date of birth, ID band Patient awake    Reviewed: Allergy & Precautions, NPO status , Patient's Chart, lab work & pertinent test results  Airway Mallampati: III  TM Distance: >3 FB Neck ROM: Full    Dental  (+) Teeth Intact, Dental Advisory Given   Pulmonary former smoker,    breath sounds clear to auscultation       Cardiovascular  Rhythm:Regular Rate:Normal     Neuro/Psych    GI/Hepatic Neg liver ROS, GERD  ,  Endo/Other  negative endocrine ROS  Renal/GU negative Renal ROS     Musculoskeletal  (+) Arthritis , Osteoarthritis,    Abdominal Normal abdominal exam  (+)   Peds  Hematology negative hematology ROS (+)   Anesthesia Other Findings   Reproductive/Obstetrics                            Lab Results  Component Value Date   WBC 6.3 08/18/2018   HGB 14.8 08/18/2018   HCT 44.6 08/18/2018   MCV 89.9 08/18/2018   PLT 242 08/18/2018   Lab Results  Component Value Date   INR 1.02 08/18/2018   INR 0.98 05/06/2017   EKG: normal sinus rhythm.  Anesthesia Physical Anesthesia Plan  ASA: II  Anesthesia Plan: Spinal   Post-op Pain Management:  Regional for Post-op pain   Induction: Intravenous  PONV Risk Score and Plan: 3 and Ondansetron, Dexamethasone and Midazolam  Airway Management Planned: Simple Face Mask and Natural Airway  Additional Equipment: None  Intra-op Plan:   Post-operative Plan:   Informed Consent: I have reviewed the patients History and Physical, chart, labs and discussed the procedure including the risks, benefits and alternatives for the proposed anesthesia with the patient or authorized representative who has indicated his/her understanding and acceptance.       Plan Discussed with: CRNA  Anesthesia Plan Comments:        Anesthesia Quick Evaluation

## 2018-08-27 ENCOUNTER — Encounter (HOSPITAL_COMMUNITY): Payer: Self-pay | Admitting: Emergency Medicine

## 2018-08-27 ENCOUNTER — Inpatient Hospital Stay (HOSPITAL_COMMUNITY)
Admission: RE | Admit: 2018-08-27 | Discharge: 2018-08-28 | DRG: 489 | Disposition: A | Discharge status: Home / Self Care | Payer: Medicare HMO | Attending: Orthopedic Surgery | Admitting: Orthopedic Surgery

## 2018-08-27 ENCOUNTER — Inpatient Hospital Stay (HOSPITAL_COMMUNITY): Payer: Medicare HMO | Admitting: Physician Assistant

## 2018-08-27 ENCOUNTER — Inpatient Hospital Stay (HOSPITAL_COMMUNITY): Payer: Medicare HMO | Admitting: Anesthesiology

## 2018-08-27 ENCOUNTER — Other Ambulatory Visit: Payer: Self-pay

## 2018-08-27 ENCOUNTER — Encounter (HOSPITAL_COMMUNITY)
Admission: RE | Disposition: A | Discharge status: Home / Self Care | Payer: Self-pay | Source: Home / Self Care | Attending: Orthopedic Surgery

## 2018-08-27 DIAGNOSIS — Z833 Family history of diabetes mellitus: Secondary | ICD-10-CM

## 2018-08-27 DIAGNOSIS — Y792 Prosthetic and other implants, materials and accessory orthopedic devices associated with adverse incidents: Secondary | ICD-10-CM | POA: Diagnosis present

## 2018-08-27 DIAGNOSIS — Z8249 Family history of ischemic heart disease and other diseases of the circulatory system: Secondary | ICD-10-CM

## 2018-08-27 DIAGNOSIS — Z87891 Personal history of nicotine dependence: Secondary | ICD-10-CM

## 2018-08-27 DIAGNOSIS — I77811 Abdominal aortic ectasia: Secondary | ICD-10-CM | POA: Diagnosis present

## 2018-08-27 DIAGNOSIS — Z7982 Long term (current) use of aspirin: Secondary | ICD-10-CM

## 2018-08-27 DIAGNOSIS — E781 Pure hyperglyceridemia: Secondary | ICD-10-CM | POA: Diagnosis present

## 2018-08-27 DIAGNOSIS — K219 Gastro-esophageal reflux disease without esophagitis: Secondary | ICD-10-CM | POA: Diagnosis present

## 2018-08-27 DIAGNOSIS — T84018A Broken internal joint prosthesis, other site, initial encounter: Secondary | ICD-10-CM

## 2018-08-27 DIAGNOSIS — Z79899 Other long term (current) drug therapy: Secondary | ICD-10-CM | POA: Diagnosis not present

## 2018-08-27 DIAGNOSIS — Z800 Family history of malignant neoplasm of digestive organs: Secondary | ICD-10-CM

## 2018-08-27 DIAGNOSIS — Z96659 Presence of unspecified artificial knee joint: Secondary | ICD-10-CM

## 2018-08-27 DIAGNOSIS — Y831 Surgical operation with implant of artificial internal device as the cause of abnormal reaction of the patient, or of later complication, without mention of misadventure at the time of the procedure: Secondary | ICD-10-CM | POA: Diagnosis present

## 2018-08-27 DIAGNOSIS — M25461 Effusion, right knee: Secondary | ICD-10-CM | POA: Diagnosis present

## 2018-08-27 DIAGNOSIS — T84022A Instability of internal right knee prosthesis, initial encounter: Principal | ICD-10-CM | POA: Diagnosis present

## 2018-08-27 HISTORY — PX: TOTAL KNEE REVISION: SHX996

## 2018-08-27 LAB — TYPE AND SCREEN
ABO/RH(D): B NEG
Antibody Screen: NEGATIVE

## 2018-08-27 SURGERY — TOTAL KNEE REVISION
Anesthesia: Spinal | Site: Knee | Laterality: Right

## 2018-08-27 MED ORDER — ACETAMINOPHEN 10 MG/ML IV SOLN: 1000.0000 mg | Freq: Once | INTRAVENOUS | Status: DC | PRN

## 2018-08-27 MED ORDER — PROPOFOL 10 MG/ML IV BOLUS
INTRAVENOUS | Status: AC
  Filled 2018-08-27: qty 20

## 2018-08-27 MED ORDER — BUPIVACAINE LIPOSOME 1.3 % IJ SUSP
INTRAMUSCULAR | Status: DC | PRN
  Administered 2018-08-27: 20 mL

## 2018-08-27 MED ORDER — PROPOFOL 10 MG/ML IV BOLUS
INTRAVENOUS | Status: DC | PRN
  Administered 2018-08-27: 40 mg via INTRAVENOUS

## 2018-08-27 MED ORDER — SODIUM CHLORIDE (PF) 0.9 % IJ SOLN
INTRAMUSCULAR | Status: DC | PRN
  Administered 2018-08-27: 60 mL

## 2018-08-27 MED ORDER — DIPHENHYDRAMINE HCL 12.5 MG/5ML PO ELIX: 12.5000 mg | ORAL_SOLUTION | ORAL | Status: DC | PRN

## 2018-08-27 MED ORDER — TRAMADOL HCL 50 MG PO TABS: 50.0000 mg | ORAL_TABLET | Freq: Four times a day (QID) | ORAL | Status: DC | PRN

## 2018-08-27 MED ORDER — LACTATED RINGERS IV SOLN: INTRAVENOUS | Status: DC

## 2018-08-27 MED ORDER — METHOCARBAMOL 500 MG PO TABS
500.0000 mg | ORAL_TABLET | Freq: Four times a day (QID) | ORAL | Status: DC | PRN
  Administered 2018-08-27 – 2018-08-28 (×3): 500 mg via ORAL
  Filled 2018-08-27 (×3): qty 1

## 2018-08-27 MED ORDER — GLYCOPYRROLATE PF 0.2 MG/ML IJ SOSY
PREFILLED_SYRINGE | INTRAMUSCULAR | Status: AC
  Filled 2018-08-27: qty 1

## 2018-08-27 MED ORDER — FENTANYL CITRATE (PF) 100 MCG/2ML IJ SOLN
INTRAMUSCULAR | Status: AC
  Filled 2018-08-27: qty 2

## 2018-08-27 MED ORDER — PHENYLEPHRINE 40 MCG/ML (10ML) SYRINGE FOR IV PUSH (FOR BLOOD PRESSURE SUPPORT)
PREFILLED_SYRINGE | INTRAVENOUS | Status: AC
  Filled 2018-08-27: qty 10

## 2018-08-27 MED ORDER — EPHEDRINE 5 MG/ML INJ
INTRAVENOUS | Status: AC
  Filled 2018-08-27: qty 10

## 2018-08-27 MED ORDER — MEPERIDINE HCL 50 MG/ML IJ SOLN: 6.2500 mg | INTRAMUSCULAR | Status: DC | PRN

## 2018-08-27 MED ORDER — ONDANSETRON HCL 4 MG PO TABS: 4.0000 mg | ORAL_TABLET | Freq: Four times a day (QID) | ORAL | Status: DC | PRN

## 2018-08-27 MED ORDER — GABAPENTIN 300 MG PO CAPS
300.0000 mg | ORAL_CAPSULE | Freq: Once | ORAL | Status: AC
  Administered 2018-08-27: 300 mg via ORAL
  Filled 2018-08-27: qty 1

## 2018-08-27 MED ORDER — MORPHINE SULFATE (PF) 2 MG/ML IV SOLN: 1.0000 mg | INTRAVENOUS | Status: DC | PRN

## 2018-08-27 MED ORDER — SODIUM CHLORIDE (PF) 0.9 % IJ SOLN
INTRAMUSCULAR | Status: AC
  Filled 2018-08-27: qty 10

## 2018-08-27 MED ORDER — TRANEXAMIC ACID-NACL 1000-0.7 MG/100ML-% IV SOLN
1000.0000 mg | INTRAVENOUS | Status: AC
  Administered 2018-08-27: 1000 mg via INTRAVENOUS
  Filled 2018-08-27: qty 100

## 2018-08-27 MED ORDER — ONDANSETRON HCL 4 MG/2ML IJ SOLN: 4.0000 mg | Freq: Four times a day (QID) | INTRAMUSCULAR | Status: DC | PRN

## 2018-08-27 MED ORDER — DEXAMETHASONE SODIUM PHOSPHATE 10 MG/ML IJ SOLN
INTRAMUSCULAR | Status: AC
  Filled 2018-08-27: qty 1

## 2018-08-27 MED ORDER — MIDAZOLAM HCL 2 MG/2ML IJ SOLN
INTRAMUSCULAR | Status: AC
  Filled 2018-08-27: qty 2

## 2018-08-27 MED ORDER — ACETAMINOPHEN 500 MG PO TABS
1000.0000 mg | ORAL_TABLET | Freq: Four times a day (QID) | ORAL | Status: AC
  Administered 2018-08-27 – 2018-08-28 (×4): 1000 mg via ORAL
  Filled 2018-08-27 (×4): qty 2

## 2018-08-27 MED ORDER — METOCLOPRAMIDE HCL 5 MG PO TABS: 5.0000 mg | ORAL_TABLET | Freq: Three times a day (TID) | ORAL | Status: DC | PRN

## 2018-08-27 MED ORDER — EPHEDRINE SULFATE-NACL 50-0.9 MG/10ML-% IV SOSY
PREFILLED_SYRINGE | INTRAVENOUS | Status: DC | PRN
  Administered 2018-08-27: 15 mg via INTRAVENOUS

## 2018-08-27 MED ORDER — METHOCARBAMOL 500 MG IVPB - SIMPLE MED
500.0000 mg | Freq: Four times a day (QID) | INTRAVENOUS | Status: DC | PRN
  Filled 2018-08-27: qty 50

## 2018-08-27 MED ORDER — PROPOFOL 10 MG/ML IV BOLUS
INTRAVENOUS | Status: AC
  Filled 2018-08-27: qty 40

## 2018-08-27 MED ORDER — ONDANSETRON HCL 4 MG/2ML IJ SOLN
INTRAMUSCULAR | Status: AC
  Filled 2018-08-27: qty 2

## 2018-08-27 MED ORDER — ONDANSETRON HCL 4 MG/2ML IJ SOLN
INTRAMUSCULAR | Status: DC | PRN
  Administered 2018-08-27: 4 mg via INTRAVENOUS

## 2018-08-27 MED ORDER — SODIUM CHLORIDE (PF) 0.9 % IJ SOLN
INTRAMUSCULAR | Status: AC
  Filled 2018-08-27: qty 50

## 2018-08-27 MED ORDER — CEFAZOLIN SODIUM-DEXTROSE 2-4 GM/100ML-% IV SOLN
2.0000 g | INTRAVENOUS | Status: AC
  Administered 2018-08-27: 2 g via INTRAVENOUS
  Filled 2018-08-27: qty 100

## 2018-08-27 MED ORDER — DEXAMETHASONE SODIUM PHOSPHATE 10 MG/ML IJ SOLN
10.0000 mg | Freq: Once | INTRAMUSCULAR | Status: AC
  Administered 2018-08-27: 10 mg via INTRAVENOUS

## 2018-08-27 MED ORDER — PHENYLEPHRINE 40 MCG/ML (10ML) SYRINGE FOR IV PUSH (FOR BLOOD PRESSURE SUPPORT)
PREFILLED_SYRINGE | INTRAVENOUS | Status: DC | PRN
  Administered 2018-08-27: 80 ug via INTRAVENOUS

## 2018-08-27 MED ORDER — DEXAMETHASONE SODIUM PHOSPHATE 10 MG/ML IJ SOLN
10.0000 mg | Freq: Once | INTRAMUSCULAR | Status: AC
  Administered 2018-08-28: 10 mg via INTRAVENOUS
  Filled 2018-08-27: qty 1

## 2018-08-27 MED ORDER — ROSUVASTATIN CALCIUM 10 MG PO TABS
10.0000 mg | ORAL_TABLET | Freq: Every evening | ORAL | Status: DC
  Administered 2018-08-27: 10 mg via ORAL
  Filled 2018-08-27: qty 1

## 2018-08-27 MED ORDER — SODIUM CHLORIDE 0.9 % IV SOLN
INTRAVENOUS | Status: DC
  Administered 2018-08-27 – 2018-08-28 (×3): via INTRAVENOUS

## 2018-08-27 MED ORDER — ROPIVACAINE HCL 7.5 MG/ML IJ SOLN
INTRAMUSCULAR | Status: DC | PRN
  Administered 2018-08-27: 20 mL via PERINEURAL

## 2018-08-27 MED ORDER — POLYETHYLENE GLYCOL 3350 17 G PO PACK: 17.0000 g | PACK | Freq: Every day | ORAL | Status: DC | PRN

## 2018-08-27 MED ORDER — CEFAZOLIN SODIUM-DEXTROSE 2-4 GM/100ML-% IV SOLN
2.0000 g | Freq: Four times a day (QID) | INTRAVENOUS | Status: AC
  Administered 2018-08-27 (×2): 2 g via INTRAVENOUS
  Filled 2018-08-27 (×2): qty 100

## 2018-08-27 MED ORDER — PHENOL 1.4 % MT LIQD: 1.0000 | OROMUCOSAL | Status: DC | PRN

## 2018-08-27 MED ORDER — METOCLOPRAMIDE HCL 5 MG/ML IJ SOLN: 5.0000 mg | Freq: Three times a day (TID) | INTRAMUSCULAR | Status: DC | PRN

## 2018-08-27 MED ORDER — GLYCOPYRROLATE PF 0.2 MG/ML IJ SOSY
PREFILLED_SYRINGE | INTRAMUSCULAR | Status: DC | PRN
  Administered 2018-08-27: .1 mg via INTRAVENOUS

## 2018-08-27 MED ORDER — FENTANYL CITRATE (PF) 100 MCG/2ML IJ SOLN
INTRAMUSCULAR | Status: DC | PRN
  Administered 2018-08-27: 100 ug via INTRAVENOUS

## 2018-08-27 MED ORDER — HYDROMORPHONE HCL 1 MG/ML IJ SOLN: 0.2500 mg | INTRAMUSCULAR | Status: DC | PRN

## 2018-08-27 MED ORDER — OXYCODONE HCL 5 MG PO TABS
5.0000 mg | ORAL_TABLET | ORAL | Status: DC | PRN
  Administered 2018-08-27 – 2018-08-28 (×5): 5 mg via ORAL
  Filled 2018-08-27 (×3): qty 1
  Filled 2018-08-27: qty 2

## 2018-08-27 MED ORDER — DOCUSATE SODIUM 100 MG PO CAPS
100.0000 mg | ORAL_CAPSULE | Freq: Two times a day (BID) | ORAL | Status: DC
  Administered 2018-08-28: 100 mg via ORAL
  Filled 2018-08-27 (×2): qty 1

## 2018-08-27 MED ORDER — ASPIRIN EC 325 MG PO TBEC
325.0000 mg | DELAYED_RELEASE_TABLET | Freq: Two times a day (BID) | ORAL | Status: DC
  Administered 2018-08-28: 325 mg via ORAL
  Filled 2018-08-27: qty 1

## 2018-08-27 MED ORDER — 0.9 % SODIUM CHLORIDE (POUR BTL) OPTIME
TOPICAL | Status: DC | PRN
  Administered 2018-08-27 (×2): 1000 mL

## 2018-08-27 MED ORDER — BUPIVACAINE IN DEXTROSE 0.75-8.25 % IT SOLN
INTRATHECAL | Status: DC | PRN
  Administered 2018-08-27: 2 mL via INTRATHECAL

## 2018-08-27 MED ORDER — ACETAMINOPHEN 10 MG/ML IV SOLN
1000.0000 mg | Freq: Four times a day (QID) | INTRAVENOUS | Status: DC
  Administered 2018-08-27: 1000 mg via INTRAVENOUS
  Filled 2018-08-27: qty 100

## 2018-08-27 MED ORDER — MENTHOL 3 MG MT LOZG: 1.0000 | LOZENGE | OROMUCOSAL | Status: DC | PRN

## 2018-08-27 MED ORDER — CHLORHEXIDINE GLUCONATE 4 % EX LIQD: 60.0000 mL | Freq: Once | CUTANEOUS | Status: DC

## 2018-08-27 MED ORDER — PROPOFOL 500 MG/50ML IV EMUL
INTRAVENOUS | Status: DC | PRN
  Administered 2018-08-27: 125 ug/kg/min via INTRAVENOUS

## 2018-08-27 MED ORDER — PROMETHAZINE HCL 25 MG/ML IJ SOLN: 6.2500 mg | INTRAMUSCULAR | Status: DC | PRN

## 2018-08-27 MED ORDER — ACETAMINOPHEN 325 MG PO TABS: 325.0000 mg | ORAL_TABLET | Freq: Once | ORAL | Status: DC

## 2018-08-27 MED ORDER — FLEET ENEMA 7-19 GM/118ML RE ENEM: 1.0000 | ENEMA | Freq: Once | RECTAL | Status: DC | PRN

## 2018-08-27 MED ORDER — LACTATED RINGERS IV SOLN
INTRAVENOUS | Status: DC
  Administered 2018-08-27 (×2): via INTRAVENOUS

## 2018-08-27 MED ORDER — BISACODYL 10 MG RE SUPP: 10.0000 mg | Freq: Every day | RECTAL | Status: DC | PRN

## 2018-08-27 MED ORDER — ACETAMINOPHEN 160 MG/5ML PO SOLN: 325.0000 mg | Freq: Once | ORAL | Status: DC

## 2018-08-27 MED ORDER — MIDAZOLAM HCL 5 MG/5ML IJ SOLN
INTRAMUSCULAR | Status: DC | PRN
  Administered 2018-08-27: 2 mg via INTRAVENOUS

## 2018-08-27 MED ORDER — SODIUM CHLORIDE 0.9 % IV SOLN: INTRAVENOUS | Status: DC

## 2018-08-27 SURGICAL SUPPLY — 55 items
BAG DECANTER FOR FLEXI CONT (MISCELLANEOUS) ×3 IMPLANT
BAG ZIPLOCK 12X15 (MISCELLANEOUS) IMPLANT
BANDAGE ACE 6X5 VEL STRL LF (GAUZE/BANDAGES/DRESSINGS) ×3 IMPLANT
BANDAGE ELASTIC 6 VELCRO ST LF (GAUZE/BANDAGES/DRESSINGS) ×3 IMPLANT
BLADE SAG 18X100X1.27 (BLADE) IMPLANT
BLADE SAW SGTL 11.0X1.19X90.0M (BLADE) IMPLANT
BLADE SURG SZ10 CARB STEEL (BLADE) ×6 IMPLANT
CLOSURE WOUND 1/2 X4 (GAUZE/BANDAGES/DRESSINGS) ×1
CLOTH BEACON ORANGE TIMEOUT ST (SAFETY) ×3 IMPLANT
COVER SURGICAL LIGHT HANDLE (MISCELLANEOUS) ×3 IMPLANT
COVER WAND RF STERILE (DRAPES) ×3 IMPLANT
CUFF TOURN SGL QUICK 34 (TOURNIQUET CUFF) ×2
CUFF TRNQT CYL 34X4X40X1 (TOURNIQUET CUFF) ×1 IMPLANT
DECANTER SPIKE VIAL GLASS SM (MISCELLANEOUS) IMPLANT
DRAPE U-SHAPE 47X51 STRL (DRAPES) ×3 IMPLANT
DRSG ADAPTIC 3X8 NADH LF (GAUZE/BANDAGES/DRESSINGS) ×3 IMPLANT
DRSG PAD ABDOMINAL 8X10 ST (GAUZE/BANDAGES/DRESSINGS) ×3 IMPLANT
DURAPREP 26ML APPLICATOR (WOUND CARE) ×3 IMPLANT
ELECT REM PT RETURN 15FT ADLT (MISCELLANEOUS) ×3 IMPLANT
EVACUATOR 1/8 PVC DRAIN (DRAIN) IMPLANT
GAUZE SPONGE 4X4 12PLY STRL (GAUZE/BANDAGES/DRESSINGS) ×3 IMPLANT
GLOVE BIO SURGEON STRL SZ7 (GLOVE) ×3 IMPLANT
GLOVE BIO SURGEON STRL SZ8 (GLOVE) ×3 IMPLANT
GLOVE BIOGEL PI IND STRL 7.0 (GLOVE) ×1 IMPLANT
GLOVE BIOGEL PI IND STRL 8 (GLOVE) ×1 IMPLANT
GLOVE BIOGEL PI INDICATOR 7.0 (GLOVE) ×2
GLOVE BIOGEL PI INDICATOR 8 (GLOVE) ×2
GOWN STRL REUS W/TWL LRG LVL3 (GOWN DISPOSABLE) ×3 IMPLANT
GOWN STRL REUS W/TWL XL LVL3 (GOWN DISPOSABLE) ×3 IMPLANT
HANDPIECE INTERPULSE COAX TIP (DISPOSABLE) ×2
HOLDER FOLEY CATH W/STRAP (MISCELLANEOUS) ×3 IMPLANT
IMMOBILIZER KNEE 20 (SOFTGOODS) ×3
IMMOBILIZER KNEE 20 THIGH 36 (SOFTGOODS) ×1 IMPLANT
INSERT TIB ATTUNE RP SZ8X16 (Insert) ×3 IMPLANT
MANIFOLD NEPTUNE II (INSTRUMENTS) ×3 IMPLANT
NS IRRIG 1000ML POUR BTL (IV SOLUTION) ×3 IMPLANT
PACK TOTAL KNEE CUSTOM (KITS) ×3 IMPLANT
PAD ABD 8X10 STRL (GAUZE/BANDAGES/DRESSINGS) ×3 IMPLANT
PADDING CAST COTTON 6X4 STRL (CAST SUPPLIES) ×3 IMPLANT
PROTECTOR NERVE ULNAR (MISCELLANEOUS) ×3 IMPLANT
SET HNDPC FAN SPRY TIP SCT (DISPOSABLE) ×1 IMPLANT
STRIP CLOSURE SKIN 1/2X4 (GAUZE/BANDAGES/DRESSINGS) ×2 IMPLANT
SUT MNCRL AB 4-0 PS2 18 (SUTURE) ×3 IMPLANT
SUT STRATAFIX 0 PDS 27 VIOLET (SUTURE) ×3
SUT VIC AB 2-0 CT1 27 (SUTURE) ×6
SUT VIC AB 2-0 CT1 TAPERPNT 27 (SUTURE) ×3 IMPLANT
SUTURE STRATFX 0 PDS 27 VIOLET (SUTURE) ×1 IMPLANT
SWAB COLLECTION DEVICE MRSA (MISCELLANEOUS) IMPLANT
SWAB CULTURE ESWAB REG 1ML (MISCELLANEOUS) IMPLANT
SYR 50ML LL SCALE MARK (SYRINGE) ×6 IMPLANT
TOWER CARTRIDGE SMART MIX (DISPOSABLE) IMPLANT
TRAY FOLEY MTR SLVR 16FR STAT (SET/KITS/TRAYS/PACK) ×3 IMPLANT
TUBE KAMVAC SUCTION (TUBING) IMPLANT
WATER STERILE IRR 1000ML POUR (IV SOLUTION) ×3 IMPLANT
WRAP KNEE MAXI GEL POST OP (GAUZE/BANDAGES/DRESSINGS) ×3 IMPLANT

## 2018-08-27 NOTE — Op Note (Signed)
NAME: Kevin Carter, Kevin Carter. MEDICAL RECORD [id number] ACCOUNT [id number] DATE OF BIRTH:1950-10-03 FACILITY: [facility] LOCATION: [facility] PHYSICIAN:Yvetta Drotar Dulcy Fanny, MD  OPERATIVE REPORT  DATE OF PROCEDURE:  08/27/2018  PREOPERATIVE DIAGNOSIS:  Unstable right total knee arthroplasty.  POSTOPERATIVE DIAGNOSIS:  Unstable right total knee arthroplasty.  PROCEDURE:  Right knee polyethylene revision.  SURGEON:  Ollen Gross, MD  ASSISTANT:  Dennie Bible, PA-C  ANESTHESIA:  Adductor canal block and spinal.  ESTIMATED BLOOD LOSS:  Minimal.  DRAINS:  Hemovac x1.  TOURNIQUET TIME:  23 minutes at 300 mmHg.  COMPLICATIONS:  None.  CONDITION:  Stable to recovery.  BRIEF CLINICAL NOTE:  The patient is a 68 year old male who had a right total knee arthroplasty done approximately a year and a half ago.  He did well initially and then has developed recurrent effusions and instability.  Infection workup was negative.   He has an unstable total knee, which is causing the effusions to develop.  He has instability in flexion and extension.  It was felt as though a thicker polyethylene could solve this.  He presents now for polyethylene versus total knee revision.  PROCEDURE IN DETAIL:  After successful administration of adductor canal block and spinal anesthetic, a tourniquet was placed high on the right thigh.  Right lower extremity prepped and draped in the usual sterile fashion.  Extremity was wrapped in  Esmarch, tourniquet inflated to 300 mmHg.  Exam under anesthesia shows significant varus valgus laxity in extension and significant AP laxity in 90 degrees of flexion.  I flexed the knee to 90 degrees made an incision with a #10 blade through  subcutaneous tissue to the level of the extensor mechanism.  A fresh blade was used to make a medial parapatellar arthrotomy.  There was a fair amount of synovial fluid present in the joint.  It was clear fluid.  I debrided the scar from underneath the   extensor mechanism.  I was then able to evert the patella.  The soft tissue over the proximal medial tibia subperiosteally elevated at the joint line with a knife and into the semimembranosus bursa with a Cobb elevator.  I was able to sublux the tibia  from the femur and remove the tibial polyethylene.  It was a size 8 rotating platform 8 mm thick for the Attune rotating platform system.  The femoral and tibial components are well aligned and found to be stable.  I attempted to elevated with an  osteotome, but the components were both stable.  I then placed a trial 14 mm insert and with the 14 there was a tiny bit of AP laxity, so I went to a 16, which allowed for full extension with excellent varus/valgus anterior and posterior stability  throughout full range of motion.  There was no laxity at all.  The trial 16 mm insert was removed and a permanent size 8 rotating platform posterior stabilized 16 mm insert was placed into the tibial tray.  The knee was reduced with outstanding  stability.  Full extension was excellent varus and valgus balance and stability going through full range of motion at the 90 degrees of flexion and excellent AP stability.  We then copiously irrigated the joint with saline solution with pulsatile lavage.   The arthrotomy was closed over a Hemovac drain with a 0 V-Loc suture.  Flexion against gravity was 135 degrees.  Patella tracks normally.  The tourniquet was released.  Total time of 23 minutes.  Minor bleeding was stopped with electrocautery.  The  drain was also placed subcutaneous and then the subcutaneous layer closed with interrupted 2-0 Vicryl.  Subcuticular was closed with running 4-0 Monocryl.  The incision was cleaned and dried and Steri-Strips and a bulky sterile dressing are applied.   Drain was hooked to suction and he was placed into a knee immobilizer, awakened and transported to recovery in stable condition.  Note that a surgical assistant was a medical  necessity for this procedure.  The surgical assistant was necessary for retraction of vital ligaments and neurovascular structures and for proper positioning of the limb for safe removal of the old implant and  safe and accurate placement of the new implant.  TN/NUANCE  D:08/27/2018 T:08/27/2018 JOB:[id number]

## 2018-08-27 NOTE — Interval H&P Note (Signed)
History and Physical Interval Note:  08/27/2018 6:53 AM  Kevin Carter  has presented today for surgery, with the diagnosis of unstable right total knee arthroplasty  The various methods of treatment have been discussed with the patient and family. After consideration of risks, benefits and other options for treatment, the patient has consented to  Procedure(s) with comments: right knee polyethylene revision (Right) - as a surgical intervention .  The patient's history has been reviewed, patient examined, no change in status, stable for surgery.  I have reviewed the patient's chart and labs.  Questions were answered to the patient's satisfaction.     [name]

## 2018-08-27 NOTE — Brief Op Note (Signed)
08/27/2018  9:17 AM  PATIENT:  Kevin Carter  68 y.o. male  PRE-OPERATIVE DIAGNOSIS:  unstable right total knee arthroplasty  POST-OPERATIVE DIAGNOSIS:  unstable right total knee arthroplasty  PROCEDURE:  Procedure(s) with comments: right knee polyethylene revision (Right) -  SURGEON:  Surgeon(s) and Role:    Ollen Gross, MD - Primary  PHYSICIAN ASSISTANT:   ASSISTANTS: Dennie Bible, PA-C   ANESTHESIA:   Adductor canal block and spinal  EBL:  25 ml  BLOOD ADMINISTERED:none  DRAINS: (Medium) Hemovact drain(s) in the right knee with  Suction Open   LOCAL MEDICATIONS USED:  OTHER Exparel  COUNTS:  YES  TOURNIQUET:   Total Tourniquet Time Documented: Thigh (Right) - 23 minutes Total: Thigh (Right) - 23 minutes   DICTATION: .Other Dictation: Dictation Number [id number]  PLAN OF CARE: Admit to inpatient   PATIENT DISPOSITION:  PACU - hemodynamically stable.

## 2018-08-27 NOTE — Transfer of Care (Addendum)
Immediate Anesthesia Transfer of Care Note  Patient: Kevin Carter  Procedure(s) Performed: right knee polyethylene revision (Right Knee)  Patient Location: PACU  Anesthesia Type:Spinal  Level of Consciousness: awake, alert  and oriented  Airway & Oxygen Therapy: Patient Spontanous Breathing and Patient connected to face mask oxygen  Post-op Assessment: Report given to RN and Post -op Vital signs reviewed and stable  Post vital signs: Reviewed and stable  Last Vitals:  Vitals Value Taken Time  BP 107/66 08/27/2018 10:01 AM  Temp    Pulse 62 08/27/2018 10:04 AM  Resp 19 08/27/2018 10:04 AM  SpO2 100 % 08/27/2018 10:04 AM  Vitals shown include unvalidated device data.  Last Pain:  Vitals:   08/27/18 0642  TempSrc: Oral  PainSc: 0-No pain      Patients Stated Pain Goal: 4 (08/27/18 3545)  Complications: No apparent anesthesia complications

## 2018-08-27 NOTE — Anesthesia Procedure Notes (Signed)
Anesthesia Regional Block: Adductor canal block   Pre-Anesthetic Checklist: ,, timeout performed, Correct Patient, Correct Site, Correct Laterality, Correct Procedure, Correct Position, site marked, Risks and benefits discussed,  Surgical consent,  Pre-op evaluation,  At surgeon's request and post-op pain management  Laterality: Right  Prep: chloraprep       Needles:  Injection technique: Single-shot  Needle Type: Echogenic Stimulator Needle     Needle Length: 9cm  Needle Gauge: 21     Additional Needles:   Procedures:,,,, ultrasound used (permanent image in chart),,,,  Narrative:  Start time: 08/27/2018 8:12 AM End time: 08/27/2018 8:17 AM Injection made incrementally with aspirations every 5 mL.  Performed by: Personally  Anesthesiologist: Shelton Silvas, MD  Additional Notes: Patient tolerated the procedure well. Local anesthetic introduced in an incremental fashion under minimal resistance after negative aspirations. No paresthesias were elicited. After completion of the procedure, no acute issues were identified and patient continued to be monitored by RN.

## 2018-08-27 NOTE — Progress Notes (Signed)
Pt transitioned to Goehner 2L

## 2018-08-27 NOTE — Evaluation (Signed)
Physical Therapy Evaluation Patient Details Name: Kevin Carter MRN: [id number] DOB: 11/17/1950 Today's Date: 08/27/2018   History of Present Illness  Pt s/p R TKR revision and with hx of R TKR in 2018  Clinical Impression  Pt s/p R TKR revision and presents with decreased R LE strength/ROM and post op pain limiting functional mobility.  Pt should progress to dc home with assist of family and states he plans follow up OP PT (appt date/time unknown).    Follow Up Recommendations Follow surgeon's recommendation for DC plan and follow-up therapies    Equipment Recommendations  None recommended by PT    Recommendations for Other Services       Precautions / Restrictions Precautions Precautions: Fall;Knee Restrictions Weight Bearing Restrictions: No Other Position/Activity Restrictions: WBAT      Mobility  Bed Mobility Overal bed mobility: Needs Assistance Bed Mobility: Supine to Sit     Supine to sit: Min guard     General bed mobility comments: cues for sequence  Transfers Overall transfer level: Needs assistance Equipment used: Rolling walker (2 wheeled) Transfers: Sit to/from Stand Sit to Stand: Min assist         General transfer comment: cues for LE management and use of UEs to self assist  Ambulation/Gait Ambulation/Gait assistance: Min assist;Min guard Gait Distance (Feet): 120 Feet Assistive device: Rolling walker (2 wheeled) Gait Pattern/deviations: Step-to pattern;Decreased step length - right;Decreased step length - left;Shuffle;Trunk flexed Gait velocity: decr   General Gait Details: cues for posture, position from RW and initial sequence  Stairs            Wheelchair Mobility    Modified Rankin (Stroke Patients Only)       Balance Overall balance assessment: No apparent balance deficits (not formally assessed)                                           Pertinent Vitals/Pain Pain Assessment: 0-10 Pain Score: 4   Pain Location: R knee Pain Descriptors / Indicators: Aching;Sore Pain Intervention(s): Limited activity within patient's tolerance;Monitored during session;Premedicated before session;Ice applied    Home Living Family/patient expects to be discharged to:: Private residence Living Arrangements: Parent;Other relatives Available Help at Discharge: Family;Available 24 hours/day Type of Home: House Home Access: Stairs to enter Entrance Stairs-Rails: Right Entrance Stairs-Number of Steps: 4 Home Layout: One level Home Equipment: Walker - 2 wheels;Cane - single point;Bedside commode;Shower seat      Prior Function Level of Independence: Independent               Hand Dominance        Extremity/Trunk Assessment        Lower Extremity Assessment Lower Extremity Assessment: RLE deficits/detail    Cervical / Trunk Assessment Cervical / Trunk Assessment: Normal  Communication   Communication: No difficulties  Cognition Arousal/Alertness: Awake/alert Behavior During Therapy: WFL for tasks assessed/performed Overall Cognitive Status: Within Functional Limits for tasks assessed                                        General Comments      Exercises Total Joint Exercises Ankle Circles/Pumps: AROM;Both;15 reps;Supine Straight Leg Raises: AAROM;AROM;Right;10 reps;Supine   Assessment/Plan    PT Assessment Patient needs continued PT services  PT Problem List  Decreased strength;Decreased range of motion;Decreased activity tolerance;Decreased mobility;Decreased knowledge of use of DME;Pain       PT Treatment Interventions DME instruction;Gait training;Stair training;Functional mobility training;Therapeutic activities;Therapeutic exercise;Patient/family education    PT Goals (Current goals can be found in the Care Plan section)  Acute Rehab PT Goals Patient Stated Goal: Regain IND PT Goal Formulation: With patient Time For Goal Achievement:  09/03/18 Potential to Achieve Goals: Good    Frequency 7X/week   Barriers to discharge        Co-evaluation               AM-PAC PT "6 Clicks" Mobility  Outcome Measure Help needed turning from your back to your side while in a flat bed without using bedrails?: A Little Help needed moving from lying on your back to sitting on the side of a flat bed without using bedrails?: A Little Help needed moving to and from a bed to a chair (including a wheelchair)?: A Little Help needed standing up from a chair using your arms (e.g., wheelchair or bedside chair)?: A Little Help needed to walk in hospital room?: A Little Help needed climbing 3-5 steps with a railing? : A Little 6 Click Score: 18    End of Session Equipment Utilized During Treatment: Gait belt Activity Tolerance: Patient tolerated treatment well Patient left: in chair;with call bell/phone within reach;with chair alarm set Nurse Communication: Mobility status PT Visit Diagnosis: Difficulty in walking, not elsewhere classified (R26.2)    Time: 9678-9381 PT Time Calculation (min) (ACUTE ONLY): 28 min   Charges:   PT Evaluation $PT Eval Low Complexity: 1 Low PT Treatments $Gait Training: 8-22 mins        Mauro Kaufmann PT Acute Rehabilitation Services Pager [phone] Office [phone]   Kevin Carter 08/27/2018, 3:04 PM

## 2018-08-27 NOTE — Plan of Care (Signed)

## 2018-08-27 NOTE — Anesthesia Postprocedure Evaluation (Signed)
Anesthesia Post Note  Patient: Kevin Carter  Procedure(s) Performed: right knee polyethylene revision (Right Knee)     Patient location during evaluation: PACU Anesthesia Type: Spinal Level of consciousness: oriented and awake and alert Pain management: pain level controlled Vital Signs Assessment: post-procedure vital signs reviewed and stable Respiratory status: spontaneous breathing, respiratory function stable and patient connected to nasal cannula oxygen Cardiovascular status: blood pressure returned to baseline and stable Postop Assessment: no headache, no backache, no apparent nausea or vomiting, spinal receding and patient able to bend at knees Anesthetic complications: no    Last Vitals:  Vitals:   08/27/18 1102 08/27/18 1211  BP: 115/67 125/67  Pulse: (!) 56 61  Resp: 18 16  Temp: 36.4 C 36.4 C  SpO2: 97% 100%    Last Pain:  Vitals:   08/27/18 1211  TempSrc: Oral  PainSc:                  Shelton Silvas

## 2018-08-27 NOTE — Anesthesia Procedure Notes (Signed)
Spinal  Start time: 08/27/2018 8:29 AM Staffing Resident/CRNA: [place] Aluel Schwarz C, CRNA Performed: resident/CRNA  Preanesthetic Checklist Completed: patient identified, site marked, surgical consent, pre-op evaluation, timeout performed, IV checked, risks and benefits discussed and monitors and equipment checked Spinal Block Patient position: sitting Prep: site prepped and draped and DuraPrep Patient monitoring: heart rate, cardiac monitor, continuous pulse ox and blood pressure Approach: midline Location: L3-4 Injection technique: single-shot Needle Needle type: Pencan  Needle gauge: 24 G Assessment Sensory level: T6 Additional Notes Spinal kit exp date checked, pt draped and prepped, tolerated well

## 2018-08-27 NOTE — Discharge Instructions (Signed)
° °Dr. Frank Aluisio °Total Joint Specialist °[facility] °[address] °[place], [place] [address] °[phone] ° °TOTAL KNEE POLYETHYLENE REVISION POSTOPERATIVE DIRECTIONS ° °Knee Rehabilitation, Guidelines Following Surgery  °Results after knee surgery are often greatly improved when you follow the exercise, range of motion and muscle strengthening exercises prescribed by your doctor. Safety measures are also important to protect the knee from further injury. Any time any of these exercises cause you to have increased pain or swelling in your knee joint, decrease the amount until you are comfortable again and slowly increase them. If you have problems or questions, call your caregiver or physical therapist for advice.  ° °HOME CARE INSTRUCTIONS  °• Remove items at home which could result in a fall. This includes throw rugs or furniture in walking pathways.  °· ICE to the affected knee every three hours for 30 minutes at a time and then as needed for pain and swelling.  Continue to use ice on the knee for pain and swelling from surgery. You may notice swelling that will progress down to the foot and ankle.  This is normal after surgery.  Elevate the leg when you are not up walking on it.   °· Continue to use the breathing machine which will help keep your temperature down.  It is common for your temperature to cycle up and down following surgery, especially at night when you are not up moving around and exerting yourself.  The breathing machine keeps your lungs expanded and your temperature down. °· Do not place pillow under knee, focus on keeping the knee straight while resting ° °DIET °You may resume your previous home diet once your are discharged from the hospital. ° °DRESSING / WOUND CARE / SHOWERING °You may shower 3 days after surgery, but keep the wounds dry during showering.  You may use an occlusive plastic wrap (Press'n Seal for example), NO SOAKING/SUBMERGING IN THE BATHTUB.  If the  bandage gets wet, change with a clean dry gauze.  If the incision gets wet, pat the wound dry with a clean towel. °You may start showering once you are discharged home but do not submerge the incision under water. Just pat the incision dry and apply a dry gauze dressing on daily. °Change the surgical dressing daily and reapply a dry dressing each time. ° °ACTIVITY °Walk with your walker as instructed. °Use walker as long as suggested by your caregivers. °Avoid periods of inactivity such as sitting longer than an hour when not asleep. This helps prevent blood clots.  °You may resume a sexual relationship in one month or when given the OK by your doctor.  °You may return to work once you are cleared by your doctor.  °Do not drive a car for 6 weeks or until released by you surgeon.  °Do not drive while taking narcotics. ° °WEIGHT BEARING °Weight bearing as tolerated with assist device (walker, cane, etc) as directed, use it as long as suggested by your surgeon or therapist, typically at least 4-6 weeks. ° °POSTOPERATIVE CONSTIPATION PROTOCOL °Constipation - defined medically as fewer than three stools per week and severe constipation as less than one stool per week. ° °One of the most common issues patients have following surgery is constipation.  Even if you have a regular bowel pattern at home, your normal regimen is likely to be disrupted due to multiple reasons following surgery.  Combination of anesthesia, postoperative narcotics, change in appetite and fluid intake all can affect your   bowels.  In order to avoid complications following surgery, here are some recommendations in order to help you during your recovery period. ° °Colace (docusate) - Pick up an over-the-counter form of Colace or another stool softener and take twice a day as long as you are requiring postoperative pain medications.  Take with a full glass of water daily.  If you experience loose stools or diarrhea, hold the colace until you stool forms  back up.  If your symptoms do not get better within 1 week or if they get worse, check with your doctor. ° °Dulcolax (bisacodyl) - Pick up over-the-counter and take as directed by the product packaging as needed to assist with the movement of your bowels.  Take with a full glass of water.  Use this product as needed if not relieved by Colace only.  ° °MiraLax (polyethylene glycol) - Pick up over-the-counter to have on hand.  MiraLax is a solution that will increase the amount of water in your bowels to assist with bowel movements.  Take as directed and can mix with a glass of water, juice, soda, coffee, or tea.  Take if you go more than two days without a movement. °Do not use MiraLax more than once per day. Call your doctor if you are still constipated or irregular after using this medication for 7 days in a row. ° °If you continue to have problems with postoperative constipation, please contact the office for further assistance and recommendations.  If you experience "the worst abdominal pain ever" or develop nausea or vomiting, please contact the office immediatly for further recommendations for treatment. ° °ITCHING ° If you experience itching with your medications, try taking only a single pain pill, or even half a pain pill at a time.  You can also use Benadryl over the counter for itching or also to help with sleep.  ° °TED HOSE STOCKINGS °Wear the elastic stockings on both legs for three weeks following surgery during the day but you may remove then at night for sleeping. ° °MEDICATIONS °See your medication summary on the “After Visit Summary” that the nursing staff will review with you prior to discharge.  You may have some home medications which will be placed on hold until you complete the course of blood thinner medication.  It is important for you to complete the blood thinner medication as prescribed by your surgeon.  Continue your approved medications as instructed at time of  discharge. ° °PRECAUTIONS °If you experience chest pain or shortness of breath - call 911 immediately for transfer to the hospital emergency department.  °If you develop a fever greater that 101 F, purulent drainage from wound, increased redness or drainage from wound, foul odor from the wound/dressing, or calf pain - CONTACT YOUR SURGEON.   °                                                °FOLLOW-UP APPOINTMENTS °Make sure you keep all of your appointments after your operation with your surgeon and caregivers. You should call the office at the above phone number and make an appointment for approximately two weeks after the date of your surgery or on the date instructed by your surgeon outlined in the "After Visit Summary". ° ° °RANGE OF MOTION AND STRENGTHENING EXERCISES  °Rehabilitation of the knee is important following a knee   injury or an operation. After just a few days of immobilization, the muscles of the thigh which control the knee become weakened and shrink (atrophy). Knee exercises are designed to build up the tone and strength of the thigh muscles and to improve knee motion. Often times heat used for twenty to thirty minutes before working out will loosen up your tissues and help with improving the range of motion but do not use heat for the first two weeks following surgery. These exercises can be done on a training (exercise) mat, on the floor, on a table or on a bed. Use what ever works the best and is most comfortable for you Knee exercises include:  °• Leg Lifts - While your knee is still immobilized in a splint or cast, you can do straight leg raises. Lift the leg to 60 degrees, hold for 3 sec, and slowly lower the leg. Repeat 10-20 times 2-3 times daily. Perform this exercise against resistance later as your knee gets better.  °• Quad and Hamstring Sets - Tighten up the muscle on the front of the thigh (Quad) and hold for 5-10 sec. Repeat this 10-20 times hourly. Hamstring sets are done by pushing  the foot backward against an object and holding for 5-10 sec. Repeat as with quad sets.  °· Leg Slides: Lying on your back, slowly slide your foot toward your buttocks, bending your knee up off the floor (only go as far as is comfortable). Then slowly slide your foot back down until your leg is flat on the floor again. °· Angel Wings: Lying on your back spread your legs to the side as far apart as you can without causing discomfort.  °A rehabilitation program following serious knee injuries can speed recovery and prevent re-injury in the future due to weakened muscles. Contact your doctor or a physical therapist for more information on knee rehabilitation.  ° °IF YOU ARE TRANSFERRED TO A SKILLED REHAB FACILITY °If the patient is transferred to a skilled rehab facility following release from the hospital, a list of the current medications will be sent to the facility for the patient to continue.  When discharged from the skilled rehab facility, please have the facility set up the patient's Home Health Physical Therapy prior to being released. Also, the skilled facility will be responsible for providing the patient with their medications at time of release from the facility to include their pain medication, the muscle relaxants, and their blood thinner medication. If the patient is still at the rehab facility at time of the two week follow up appointment, the skilled rehab facility will also need to assist the patient in arranging follow up appointment in our office and any transportation needs. ° °MAKE SURE YOU:  °• Understand these instructions.  °• Get help right away if you are not doing well or get worse.  ° ° °Pick up stool softner and laxative for home use following surgery while on pain medications. °Do not submerge incision under water. °Please use good hand washing techniques while changing dressing each day. °May shower starting three days after surgery. °Please use a clean towel to pat the incision dry  following showers. °Continue to use ice for pain and swelling after surgery. °Do not use any lotions or creams on the incision until instructed by your surgeon. ° °

## 2018-08-28 ENCOUNTER — Encounter (HOSPITAL_COMMUNITY): Payer: Self-pay | Admitting: Orthopedic Surgery

## 2018-08-28 LAB — BASIC METABOLIC PANEL
Anion gap: 6 (ref 5–15)
BUN: 20 mg/dL (ref 8–23)
CO2: 22 mmol/L (ref 22–32)
Calcium: 8.2 mg/dL — ABNORMAL LOW (ref 8.9–10.3)
Chloride: 109 mmol/L (ref 98–111)
Creatinine, Ser: 0.59 mg/dL — ABNORMAL LOW (ref 0.61–1.24)
GFR calc Af Amer: >60 mL/min (ref >60)
Glucose, Bld: 91 mg/dL (ref 70–99)
Potassium: 3.6 mmol/L (ref 3.5–5.1)
Sodium: 137 mmol/L (ref 135–145)

## 2018-08-28 LAB — CBC
HCT: 36.1 % — ABNORMAL LOW (ref 39.0–52.0)
Hemoglobin: 12.1 g/dL — ABNORMAL LOW (ref 13.0–17.0)
MCH: 30.9 pg (ref 26.0–34.0)
MCHC: 33.5 g/dL (ref 30.0–36.0)
MCV: 92.1 fL (ref 80.0–100.0)
PLATELETS: 203 10*3/uL (ref 150–400)
RBC: 3.92 MIL/uL — ABNORMAL LOW (ref 4.22–5.81)
RDW: 12.4 % (ref 11.5–15.5)
WBC: 10.1 10*3/uL (ref 4.0–10.5)
nRBC: 0 % (ref 0.0–0.2)

## 2018-08-28 MED ORDER — TRAMADOL HCL 50 MG PO TABS: 50.0000 mg | ORAL_TABLET | Freq: Four times a day (QID) | ORAL | 0 refills | Status: AC | PRN

## 2018-08-28 MED ORDER — OXYCODONE HCL 5 MG PO TABS: 5.0000 mg | ORAL_TABLET | Freq: Four times a day (QID) | ORAL | 0 refills | Status: AC | PRN

## 2018-08-28 MED ORDER — ASPIRIN 325 MG PO TBEC: 325.0000 mg | DELAYED_RELEASE_TABLET | Freq: Two times a day (BID) | ORAL | 0 refills | Status: AC

## 2018-08-28 MED ORDER — METHOCARBAMOL 500 MG PO TABS: 500.0000 mg | ORAL_TABLET | Freq: Four times a day (QID) | ORAL | 0 refills | Status: AC | PRN

## 2018-08-28 NOTE — Progress Notes (Signed)
Discharge paperwork discussed with pt and sister at the bedside. They demonstrated understanding.  Pt doing well and ready for d/c at this time. Pt to be escorted by wheelchair to main lobby.

## 2018-08-28 NOTE — Care Management Note (Signed)
Case Management Note  Patient Details  Name: Kevin Carter MRN: [id number] Date of Birth: 11/16/1950  Subjective/Objective:      Spoke with patient at bedside. Confirmed plan for OP PT, already arranged. Has RW and 3n1. [phone]              Action/Plan:   Expected Discharge Date:  08/28/18               Expected Discharge Plan:  OP Rehab  In-House Referral:  NA  Discharge planning Services  CM Consult  Post Acute Care Choice:  NA Choice offered to:  Patient  DME Arranged:  N/A DME Agency:  NA  HH Arranged:  NA HH Agency:  NA  Status of Service:  Completed, signed off  If discussed at Long Length of Stay Meetings, dates discussed:    Additional Comments:  Alexis Goodell, RN 08/28/2018, 9:59 AM

## 2018-08-28 NOTE — Progress Notes (Signed)
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  Pt reviewed stairs and home therex program with written instruction provided.   Follow Up Recommendations  Follow surgeon's recommendation for DC plan and follow-up therapies     Equipment Recommendations  None recommended by PT    Recommendations for Other Services       Precautions / Restrictions Precautions Precautions: Fall;Knee Restrictions Weight Bearing Restrictions: No Other Position/Activity Restrictions: WBAT    Mobility  Bed Mobility Overal bed mobility: Needs Assistance Bed Mobility: Supine to Sit     Supine to sit: Modified independent (Device/Increase time)     General bed mobility comments: pt unassisted to EOB  Transfers Overall transfer level: Needs assistance Equipment used: Rolling walker (2 wheeled) Transfers: Sit to/from Stand Sit to Stand: Supervision         General transfer comment: cues for LE management and use of UEs to self assist  Ambulation/Gait Ambulation/Gait assistance: Min guard;Supervision Gait Distance (Feet): 200 Feet Assistive device: Rolling walker (2 wheeled) Gait Pattern/deviations: Decreased step length - right;Decreased step length - left;Shuffle;Trunk flexed;Step-to pattern;Step-through pattern Gait velocity: decr   General Gait Details: cues for posture, position from RW and initial sequence   Stairs Stairs: Yes Stairs assistance: Min guard Stair Management: One rail Right;Step to pattern;With cane;Forwards Number of Stairs: 4 General stair comments: cues for sequence and foot/cane placement   Wheelchair Mobility    Modified Rankin (Stroke Patients Only)       Balance Overall balance assessment: No apparent balance  deficits (not formally assessed)                                          Cognition Arousal/Alertness: Awake/alert Behavior During Therapy: WFL for tasks assessed/performed Overall Cognitive Status: Within Functional Limits for tasks assessed                                        Exercises Total Joint Exercises Ankle Circles/Pumps: AROM;Both;15 reps;Supine Quad Sets: AROM;Both;10 reps;Supine Heel Slides: AAROM;Right;15 reps;Supine Straight Leg Raises: AROM;Right;10 reps;Supine Long Arc Quad: AROM;Right;10 reps;Seated Knee Flexion: AAROM;AROM;Right;10 reps;Seated Goniometric ROM: AAROM R knee -5 - 85    General Comments        Pertinent Vitals/Pain Pain Assessment: 0-10 Pain Score: 4  Pain Location: R knee Pain Descriptors / Indicators: Aching;Sore Pain Intervention(s): Limited activity within patient's tolerance;Monitored during session;Premedicated before session;Ice applied    Home Living                      Prior Function            PT Goals (current goals can now be found in the care plan section) Acute Rehab PT Goals Patient Stated Goal: Regain IND PT Goal Formulation: With patient Time For Goal Achievement: 09/03/18 Potential to Achieve Goals: Good Progress towards PT goals: Progressing toward goals    Frequency    7X/week      PT Plan Current plan remains appropriate    Co-evaluation  AM-PAC PT "6 Clicks" Mobility   Outcome Measure  Help needed turning from your back to your side while in a flat bed without using bedrails?: None Help needed moving from lying on your back to sitting on the side of a flat bed without using bedrails?: None Help needed moving to and from a bed to a chair (including a wheelchair)?: A Little Help needed standing up from a chair using your arms (e.g., wheelchair or bedside chair)?: A Little Help needed to walk in hospital room?: A Little Help needed  climbing 3-5 steps with a railing? : A Little 6 Click Score: 20    End of Session Equipment Utilized During Treatment: Gait belt Activity Tolerance: Patient tolerated treatment well Patient left: in chair;with call bell/phone within reach;with chair alarm set Nurse Communication: Mobility status PT Visit Diagnosis: Difficulty in walking, not elsewhere classified (R26.2)     Time: 1749-4496 PT Time Calculation (min) (ACUTE ONLY): 41 min  Charges:  $Gait Training: 8-22 mins $Therapeutic Exercise: 8-22 mins $Therapeutic Activity: 8-22 mins                     Mauro Kaufmann PT Acute Rehabilitation Services Pager [phone] Office [phone]    Kevin Carter 08/28/2018, 11:28 AM

## 2018-08-28 NOTE — Plan of Care (Signed)
Pt alert and oriented, doing well this am. Pain well controlled, progressing well with PT. Plan to d/c home per MD order. RN will monitor.

## 2018-08-28 NOTE — Progress Notes (Signed)
   Subjective: 1 Day Post-Op Procedure(s) (LRB): right knee polyethylene revision (Right) Patient reports pain as mild.   Patient seen in rounds for Dr. Lequita Halt. Patient is well, and has had no acute complaints or problems. States he has minimal pain in the right knee and is ready to go home. No issues overnight. Foley catheter removed this AM. Denies chest pain, SOB, or calf pain. We will continue therapy today.   Objective: Vital signs in last 24 hours: Temp:  [97.4 F (36.3 C)-98.7 F (37.1 C)] 97.7 F (36.5 C) (02/06 0454) Pulse Rate:  [56-69] 69 (02/06 0454) Resp:  [10-20] 20 (02/06 0454) BP: (107-140)/(46-74) 140/74 (02/06 0454) SpO2:  [95 %-100 %] 96 % (02/06 0454)  Intake/Output from previous day:  Intake/Output Summary (Last 24 hours) at 08/28/2018 0718 Last data filed at 08/28/2018 0530 Gross per 24 hour  Intake 4953.91 ml  Output 2330 ml  Net 2623.91 ml    Labs: Recent Labs    08/28/18 0639  HGB 12.1*   Recent Labs    08/28/18 0639  WBC 10.1  RBC 3.92*  HCT 36.1*  PLT 203   Recent Labs    08/28/18 0639  NA 137  K 3.6  CL 109  CO2 22  BUN 20  CREATININE 0.59*  GLUCOSE 91  CALCIUM 8.2*   Exam: General - Patient is Alert and Oriented Extremity - Neurologically intact Neurovascular intact Sensation intact distally Dorsiflexion/Plantar flexion intact Dressing - dressing C/D/I Motor Function - intact, moving foot and toes well on exam.   Past Medical History:  Diagnosis Date  . Carpal tunnel syndrome, right   . Ectatic abdominal aorta (HCC)    per abd. aorta ultrasound in epic dated 06-13-2018. 2.8cm  . GERD (gastroesophageal reflux disease)    no meds  . Hypertriglyceridemia   . Nocturia   . OA (osteoarthritis)    wrist, knees  . Pre-diabetes    followed by pcp  . Wears glasses     Assessment/Plan: 1 Day Post-Op Procedure(s) (LRB): right knee polyethylene revision (Right) Principal Problem:   Failed total knee arthroplasty  (HCC)  Estimated body mass index is 27.16 kg/m as calculated from the following:   Height as of this encounter: 6\' 2"  (1.88 m).   Weight as of this encounter: 95.9 kg. Advance diet Up with therapy D/C IV fluids  DVT Prophylaxis - Aspirin Weight bearing as tolerated. D/C O2 and pulse ox and try on room air. Hemovac pulled without difficulty, will continue therapy.  Plan is to go Home after hospital stay. Plan on discharge later today once passes therapy. Scheduled for outpatient physical therapy on Monday. Follow-up in the office in 2 weeks.   Arther Abbott, PA-C Orthopedic Surgery 08/28/2018, 7:18 AM

## 2018-09-01 NOTE — Discharge Summary (Signed)
Physician Discharge Summary   Patient ID: Kevin Carter MRN: [id number] DOB/AGE: [birth year] 68 y.o.  Admit date: 08/27/2018 Discharge date: 08/28/2018  Primary Diagnosis: Unstable right total knee arthroplasty   Admission Diagnoses:  Past Medical History:  Diagnosis Date  . Carpal tunnel syndrome, right   . Ectatic abdominal aorta (HCC)    per abd. aorta ultrasound in epic dated 06-13-2018. 2.8cm  . GERD (gastroesophageal reflux disease)    no meds  . Hypertriglyceridemia   . Nocturia   . OA (osteoarthritis)    wrist, knees  . Pre-diabetes    followed by pcp  . Wears glasses    Discharge Diagnoses:   Principal Problem:   Failed total knee arthroplasty (HCC)  Estimated body mass index is 27.16 kg/m as calculated from the following:   Height as of this encounter: 6\' 2"  (1.88 m).   Weight as of this encounter: 95.9 kg.  Procedure:  Procedure(s) (LRB): right knee polyethylene revision (Right)   Consults: None  HPI: The patient is a 68 year old male who had a right total knee arthroplasty done approximately a year and a half ago.  He did well initially and then has developed recurrent effusions and instability.  Infection workup was negative. He has an unstable total knee, which is causing the effusions to develop.  He has instability in flexion and extension.  It was felt as though a thicker polyethylene could solve this.  He presents now for polyethylene versus total knee revision.  Laboratory Data: Admission on 08/27/2018, Discharged on 08/28/2018  Component Date Value Ref Range Status  . WBC 08/28/2018 10.1  4.0 - 10.5 K/uL Final  . RBC 08/28/2018 3.92* 4.22 - 5.81 MIL/uL Final  . Hemoglobin 08/28/2018 12.1* 13.0 - 17.0 g/dL Final  . HCT 22/97/9892 36.1* 39.0 - 52.0 % Final  . MCV 08/28/2018 92.1  80.0 - 100.0 fL Final  . MCH 08/28/2018 30.9  26.0 - 34.0 pg Final  . MCHC 08/28/2018 33.5  30.0 - 36.0 g/dL Final  . RDW 11/94/1740 12.4  11.5 - 15.5 % Final  .  Platelets 08/28/2018 203  150 - 400 K/uL Final  . nRBC 08/28/2018 0.0  0.0 - 0.2 % Final   Performed at [facility], [address] [place], [place] [address]  . Sodium 08/28/2018 137  135 - 145 mmol/L Final  . Potassium 08/28/2018 3.6  3.5 - 5.1 mmol/L Final  . Chloride 08/28/2018 109  98 - 111 mmol/L Final  . CO2 08/28/2018 22  22 - 32 mmol/L Final  . Glucose, Bld 08/28/2018 91  70 - 99 mg/dL Final  . BUN 18/56/3149 20  8 - 23 mg/dL Final  . Creatinine, Ser 08/28/2018 0.59* 0.61 - 1.24 mg/dL Final  . Calcium 70/26/3785 8.2* 8.9 - 10.3 mg/dL Final  . GFR calc non Af Amer 08/28/2018 >60  >60 mL/min Final  . GFR calc Af Amer 08/28/2018 >60  >60 mL/min Final  . Anion gap 08/28/2018 6  5 - 15 Final   Performed at [facility], [address], [place], [place] [address]  Hospital Outpatient Visit on 08/18/2018  Component Date Value Ref Range Status  . aPTT 08/18/2018 30  24 - 36 seconds Final   Performed at [facility], [address], [place], [place] [address]  . WBC 08/18/2018 6.3  4.0 - 10.5 K/uL Final  . RBC 08/18/2018 4.96  4.22 - 5.81 MIL/uL Final  . Hemoglobin 08/18/2018 14.8  13.0 - 17.0 g/dL Final  . HCT 17/51/0258 44.6  39.0 - 52.0 % Final  . MCV 08/18/2018 89.9  80.0 - 100.0 fL Final  . MCH 08/18/2018 29.8  26.0 - 34.0 pg Final  . MCHC 08/18/2018 33.2  30.0 - 36.0 g/dL Final  . RDW 52/77/8242 12.5  11.5 - 15.5 % Final  . Platelets 08/18/2018 242  150 - 400 K/uL Final  . nRBC 08/18/2018 0.0  0.0 - 0.2 % Final  . Neutrophils Relative % 08/18/2018 61  % Final  . Neutro Abs 08/18/2018 3.9  1.7 - 7.7 K/uL Final  . Lymphocytes Relative 08/18/2018 24  % Final  . Lymphs Abs 08/18/2018 1.5  0.7 - 4.0 K/uL Final  . Monocytes Relative 08/18/2018 11  % Final  . Monocytes Absolute 08/18/2018 0.7  0.1 - 1.0 K/uL Final  . Eosinophils Relative 08/18/2018 3  % Final  . Eosinophils Absolute 08/18/2018 0.2  0.0 - 0.5 K/uL Final  .  Basophils Relative 08/18/2018 1  % Final  . Basophils Absolute 08/18/2018 0.1  0.0 - 0.1 K/uL Final  . Immature Granulocytes 08/18/2018 0  % Final  . Abs Immature Granulocytes 08/18/2018 0.02  0.00 - 0.07 K/uL Final   Performed at [facility], [address] [place], [place] [address]  . Sodium 08/18/2018 136  135 - 145 mmol/L Final  . Potassium 08/18/2018 4.2  3.5 - 5.1 mmol/L Final  . Chloride 08/18/2018 104  98 - 111 mmol/L Final  . CO2 08/18/2018 25  22 - 32 mmol/L Final  . Glucose, Bld 08/18/2018 90  70 - 99 mg/dL Final  . BUN 44/31/5400 23  8 - 23 mg/dL Final  . Creatinine, Ser 08/18/2018 0.66  0.61 - 1.24 mg/dL Final  . Calcium 86/76/1950 9.2  8.9 - 10.3 mg/dL Final  . Total Protein 08/18/2018 7.4  6.5 - 8.1 g/dL Final  . Albumin 93/26/7124 4.5  3.5 - 5.0 g/dL Final  . AST 58/03/9832 21  15 - 41 U/L Final  . ALT 08/18/2018 26  0 - 44 U/L Final  . Alkaline Phosphatase 08/18/2018 53  38 - 126 U/L Final  . Total Bilirubin 08/18/2018 0.8  0.3 - 1.2 mg/dL Final  . GFR calc non Af Amer 08/18/2018 >60  >60 mL/min Final  . GFR calc Af Amer 08/18/2018 >60  >60 mL/min Final  . Anion gap 08/18/2018 7  5 - 15 Final   Performed at [facility], [address] [place], [place] [address]  . Prothrombin Time 08/18/2018 13.3  11.4 - 15.2 seconds Final  . INR 08/18/2018 1.02   Final   Performed at [facility], [address], [place], [place] [address]  . Color, Urine 08/18/2018 AMBER* YELLOW Final   BIOCHEMICALS MAY BE AFFECTED BY COLOR  . APPearance 08/18/2018 HAZY* CLEAR Final  . Specific Gravity, Urine 08/18/2018 1.020  1.005 - 1.030 Final  . pH 08/18/2018 6.0  5.0 - 8.0 Final  . Glucose, UA 08/18/2018 NEGATIVE  NEGATIVE mg/dL Final  . Hgb urine dipstick 08/18/2018 NEGATIVE  NEGATIVE Final  . Bilirubin Urine 08/18/2018 NEGATIVE  NEGATIVE Final  . Ketones, ur 08/18/2018 NEGATIVE  NEGATIVE mg/dL Final  . Protein, ur 34/19/3790  NEGATIVE  NEGATIVE mg/dL Final  . Nitrite 24/03/7352 NEGATIVE  NEGATIVE Final  . Leukocytes, UA 08/18/2018 NEGATIVE  NEGATIVE Final   Performed at [facility], [address] [place], [place] [address]  .  ABO/RH(D) 08/18/2018 B NEG   Final  . Antibody Screen 08/18/2018 NEG   Final  . Sample Expiration 08/18/2018 08/30/2018   Final  . Extend sample reason 08/18/2018    Final                   Value:NO TRANSFUSIONS OR PREGNANCY IN THE PAST 3 MONTHS Performed at [facility], [address] [place], [place] [address]   . MRSA, PCR 08/18/2018 NEGATIVE  NEGATIVE Final  . Staphylococcus aureus 08/18/2018 NEGATIVE  NEGATIVE Final   Comment: (NOTE) The Xpert SA Assay (FDA approved for NASAL specimens in patients 66 years of age and older), is one component of a comprehensive surveillance program. It is not intended to diagnose infection nor to guide or monitor treatment. Performed at [facility], [address] [place], [place] [address]      X-Rays:No results found.  EKG: Orders placed or performed during the hospital encounter of 08/18/18  . EKG 12-Lead  . EKG 12-Lead     Hospital Course: Kevin Carter is a 68 y.o. who was admitted to [facility]. They were brought to the operating room on 08/27/2018 and underwent Procedure(s): right knee polyethylene revision.  Patient tolerated the procedure well and was later transferred to the recovery room and then to the orthopaedic floor for postoperative care. They were given PO and IV analgesics for pain control following their surgery. They were given 24 hours of postoperative antibiotics of  Anti-infectives (From admission, onward)   Start     Dose/Rate Route Frequency Ordered Stop   08/27/18 1400  ceFAZolin (ANCEF) IVPB 2g/100 mL premix     2 g 200 mL/hr over 30 Minutes Intravenous Every 6 hours 08/27/18 1114 08/27/18 2125   08/27/18 0645  ceFAZolin (ANCEF) IVPB  2g/100 mL premix     2 g 200 mL/hr over 30 Minutes Intravenous On call to O.R. 08/27/18 [id number] 08/27/18 0836     and started on DVT prophylaxis in the form of Aspirin.   PT and OT were ordered for total joint protocol. Discharge planning consulted to help with postop disposition and equipment needs.  Patient had a good night on the evening of surgery. They started to get up OOB with therapy on POD #0. Pt was seen during rounds and was ready to go home pending progress with therapy. Hemovac drain was pulled without difficulty. He worked with therapy on POD #1 and was meeting his goals. Pt was discharged to home later that day in stable condition.  Diet: Regular diet Activity: WBAT Follow-up: in 2 weeks Disposition: Home with outpatient physical therapy Discharged Condition: stable   Discharge Instructions    Call MD / Call 911   Complete by:  As directed    If you experience chest pain or shortness of breath, CALL 911 and be transported to the hospital emergency room.  If you develope a fever above 101 F, pus (white drainage) or increased drainage or redness at the wound, or calf pain, call your surgeon's office.   Change dressing   Complete by:  As directed    Change dressing on Friday, then change the dressing daily with sterile 4 x 4 inch gauze dressing and apply TED hose.   Constipation Prevention   Complete by:  As directed    Drink plenty of fluids.  Prune juice may be helpful.  You may use a stool softener, such as Colace (over the counter) 100 mg  twice a day.  Use MiraLax (over the counter) for constipation as needed.   Diet - low sodium heart healthy   Complete by:  As directed    Do not put a pillow under the knee. Place it under the heel.   Complete by:  As directed    Driving restrictions   Complete by:  As directed    No driving for two weeks   TED hose   Complete by:  As directed    Use stockings (TED hose) for three weeks on both leg(s).  You may remove them at night for  sleeping.   Weight bearing as tolerated   Complete by:  As directed      Allergies as of 08/28/2018   No Known Allergies     Medication List    TAKE these medications   aspirin 325 MG EC tablet Take 1 tablet (325 mg total) by mouth 2 (two) times daily for 20 days. Then resume one 81 mg aspirin once a day. What changed:    medication strength  how much to take  when to take this  additional instructions   Fish Oil 1000 MG Caps Take 1 capsule by mouth daily.   methocarbamol 500 MG tablet Commonly known as:  ROBAXIN Take 1 tablet (500 mg total) by mouth every 6 (six) hours as needed for muscle spasms.   MULTIVITAMIN ADULT PO Take 1 tablet by mouth daily. ONE-A-DAY   oxyCODONE 5 MG immediate release tablet Commonly known as:  Oxy IR/ROXICODONE Take 1-2 tablets (5-10 mg total) by mouth every 6 (six) hours as needed for severe pain.   rosuvastatin 10 MG tablet Commonly known as:  CRESTOR Take 10 mg by mouth every evening.   traMADol 50 MG tablet Commonly known as:  ULTRAM Take 1-2 tablets (50-100 mg total) by mouth every 6 (six) hours as needed for moderate pain.            Discharge Care Instructions  (From admission, onward)         Start     Ordered   08/28/18 0000  Weight bearing as tolerated     08/28/18 0724   08/28/18 0000  Change dressing    Comments:  Change dressing on Friday, then change the dressing daily with sterile 4 x 4 inch gauze dressing and apply TED hose.   08/28/18 0724         Follow-up Information    Ollen GrossAluisio, Frank, MD. Schedule an appointment as soon as possible for a visit on 09/11/2018.   Specialty:  Orthopedic Surgery Contact information: [address] [place] [address] [phone]           Signed: Arther AbbottKristie Ayo Guarino, PA-C Orthopedic Surgery 09/01/2018, 9:15 AM

## 2019-05-01 IMAGING — US US ABDOMINAL AORTA SCREENING AAA
1 series · 14 of 15 positions shown · non-contrast
Comparison: None.

CLINICAL DATA: Male between 65-75 years of age with a smoking
history.

EXAM:
US ABDOMINAL AORTA MEDICARE SCREENING
TECHNIQUE: Ultrasound examination of the abdominal aorta was performed as a
screening evaluation for abdominal aortic aneurysm.

[Series 1: us abdominal aorta screening aaa · 0.23mm/px · 14 of 15 slices shown]
[im 1/15]
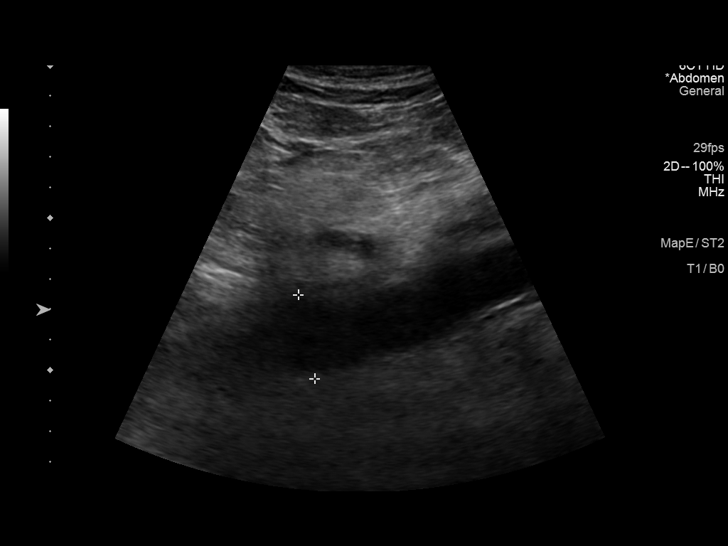
[im 2/15]
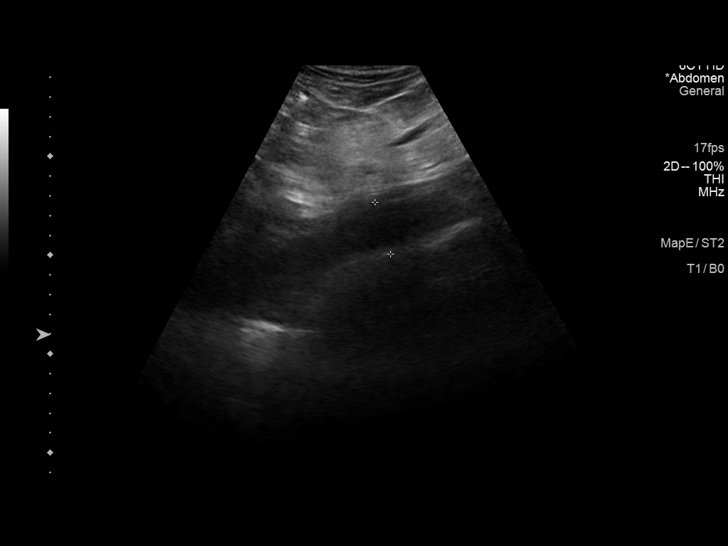
[im 3/15]
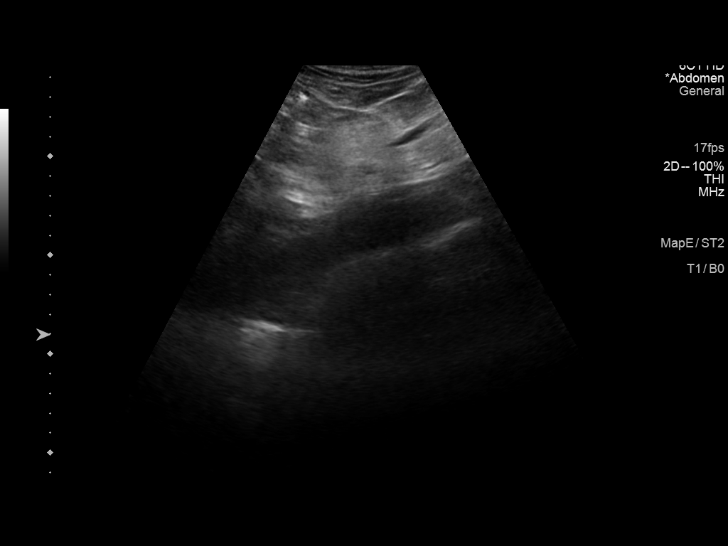
[im 4/15]
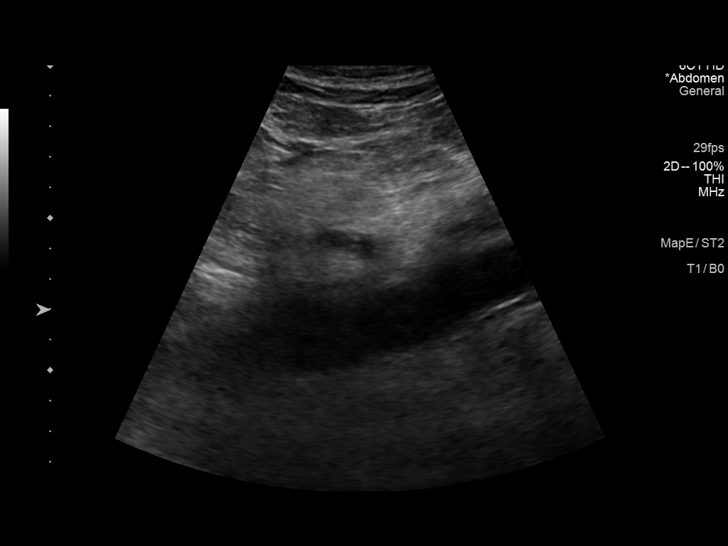
[im 5/15]
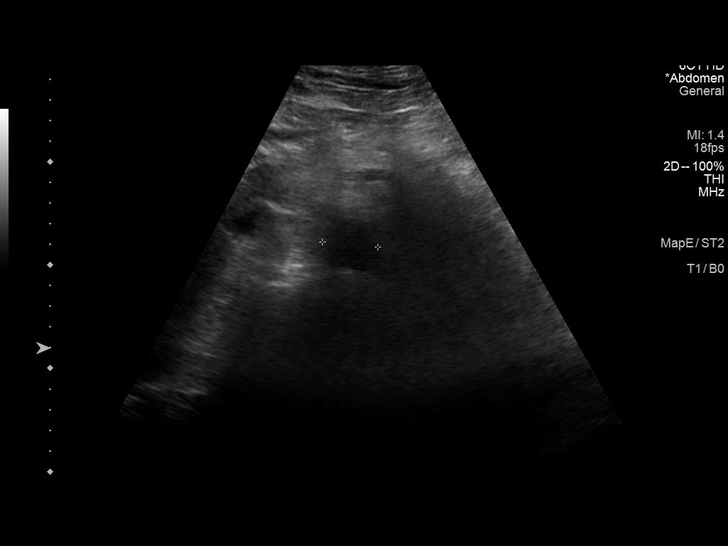
[im 6/15]
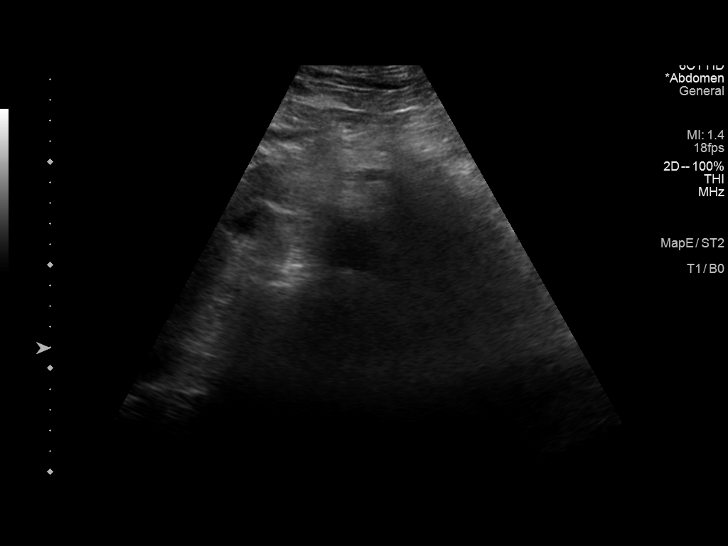
[im 7/15]
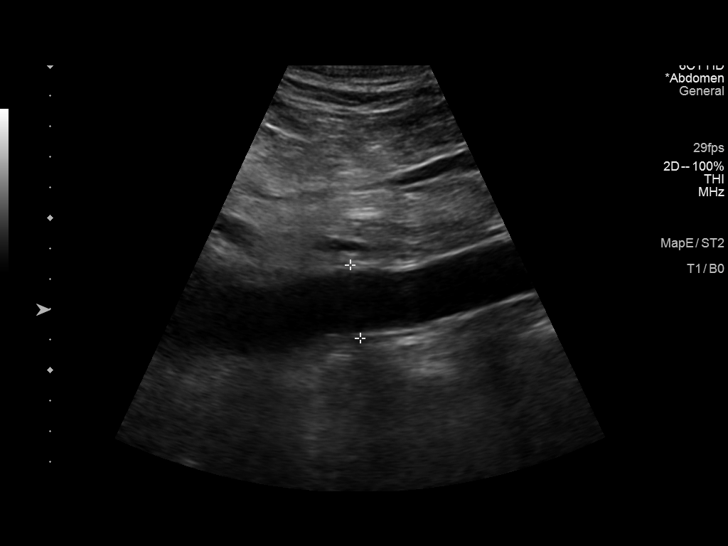
[im 9/15]
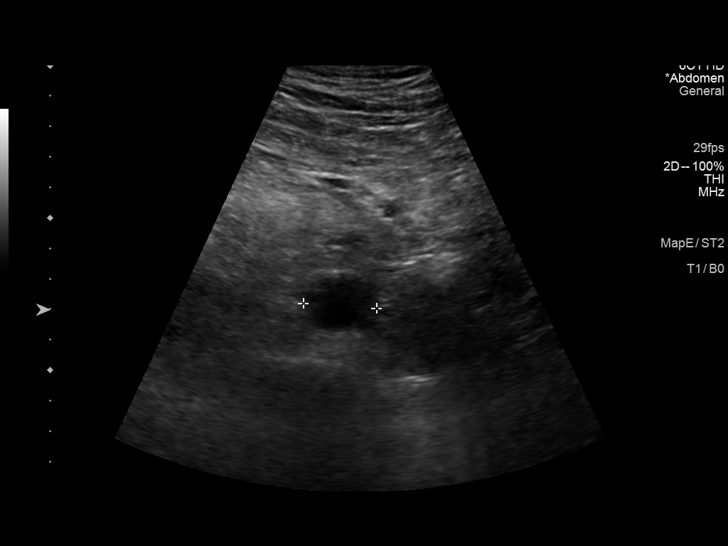
[im 10/15]
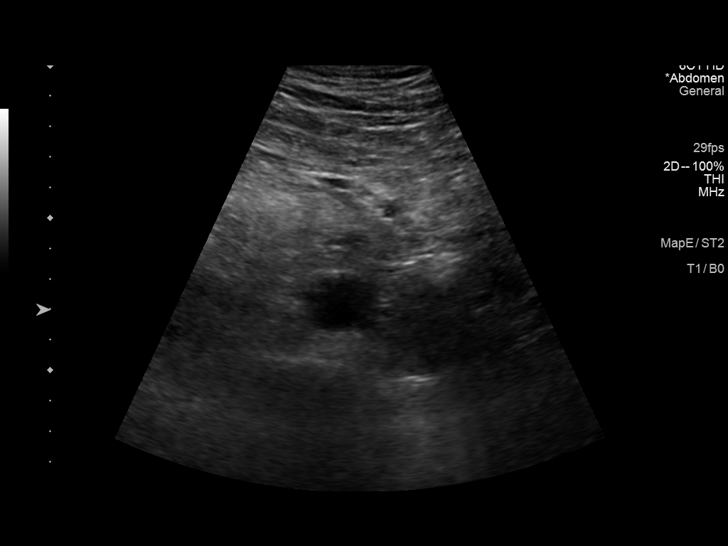
[im 11/15]
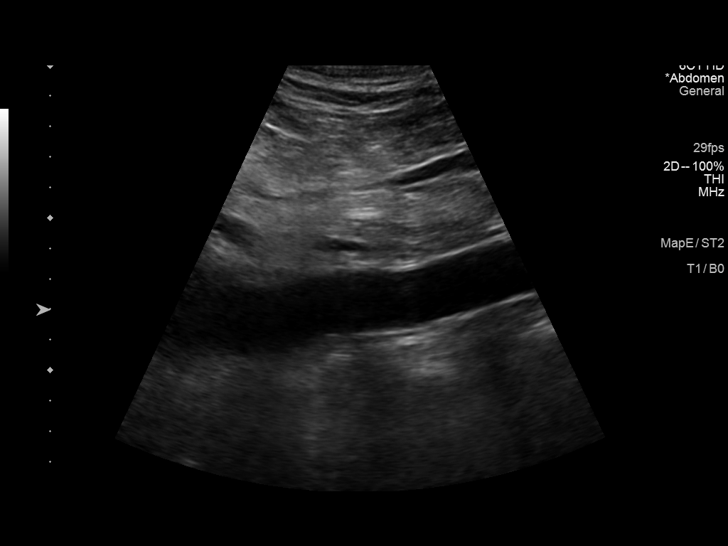
[im 12/15]
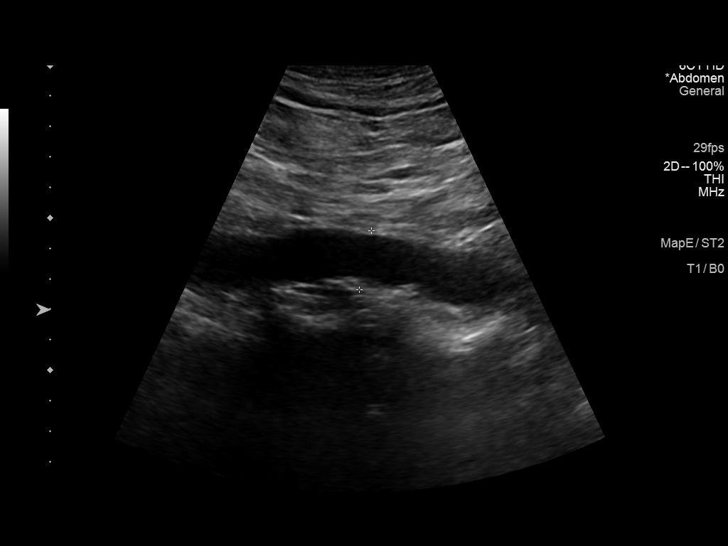
[im 13/15]
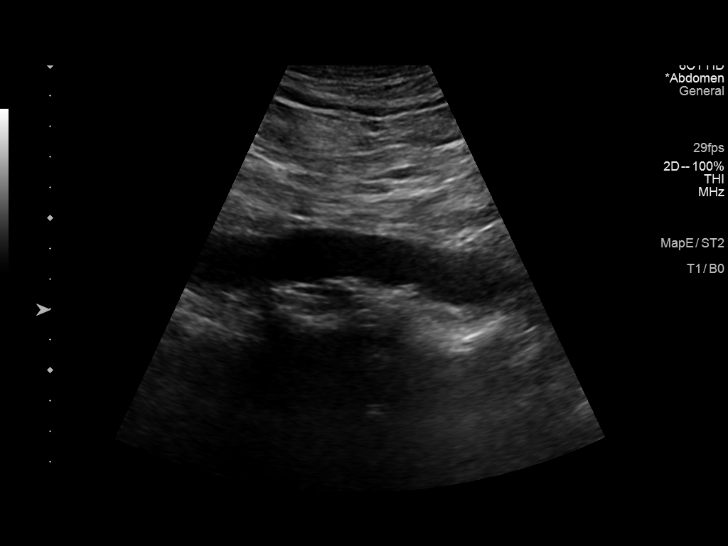
[im 14/15]
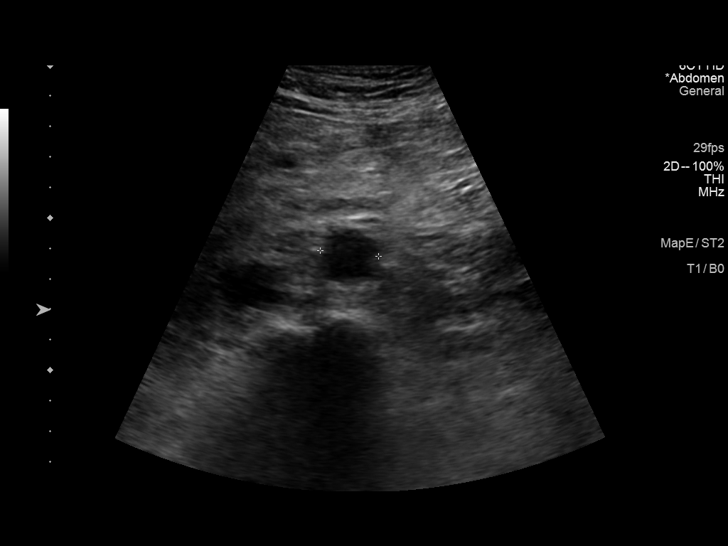
[im 15/15]
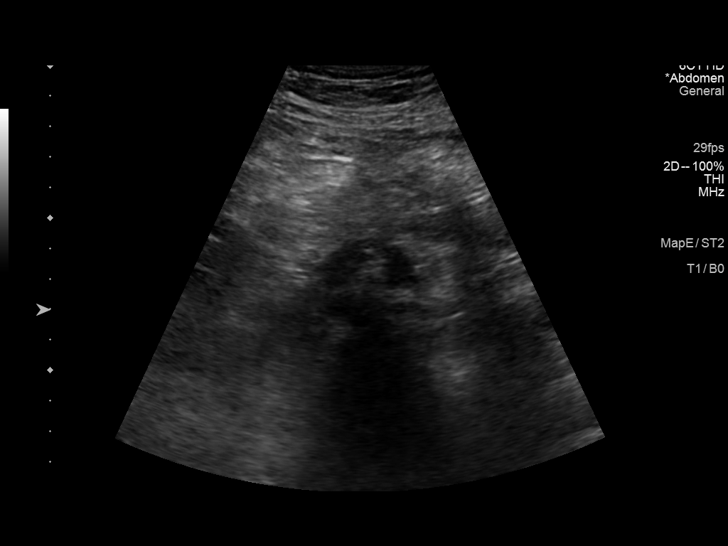

[14 of 15 positions shown; findings below may reference images not displayed]

FINDINGS: Abdominal aortic measurements as follows:

Proximal:  2.8 x 2.7 cm cm

Mid:  2.4 x 2.4 cm cm

Distal:  2 x 1.9 cm cm
IMPRESSION: Ectatic 2.8 cm abdominal aorta at risk for aneurysm development.
Recommend followup by ultrasound in 5 years. This recommendation
follows ACR consensus guidelines: White Paper of the ACR Incidental
